# Patient Record
Sex: Female | Born: 1985 | Race: Black or African American | Hispanic: No | Marital: Single | Smoking: Former smoker
Health system: Southern US, Community
[De-identification: ages and names within clinical notes are randomized; demographics above are authoritative.]

## PROBLEM LIST (undated history)

## (undated) DIAGNOSIS — J45909 Unspecified asthma, uncomplicated: Secondary | ICD-10-CM

## (undated) DIAGNOSIS — G43909 Migraine, unspecified, not intractable, without status migrainosus: Secondary | ICD-10-CM

## (undated) DIAGNOSIS — G510 Bell's palsy: Secondary | ICD-10-CM

## (undated) DIAGNOSIS — I10 Essential (primary) hypertension: Secondary | ICD-10-CM

## (undated) HISTORY — PX: HERNIA REPAIR: SHX51

## (undated) HISTORY — DX: Migraine, unspecified, not intractable, without status migrainosus: G43.909

## (undated) HISTORY — PX: WISDOM TOOTH EXTRACTION: SHX21

---

## 2016-12-21 ENCOUNTER — Emergency Department (HOSPITAL_BASED_OUTPATIENT_CLINIC_OR_DEPARTMENT_OTHER)
Admission: EM | Admit: 2016-12-21 | Discharge: 2016-12-21 | Disposition: A | Discharge status: Home / Self Care | Payer: Medicaid Other | Attending: Emergency Medicine | Admitting: Emergency Medicine

## 2016-12-21 ENCOUNTER — Encounter (HOSPITAL_BASED_OUTPATIENT_CLINIC_OR_DEPARTMENT_OTHER): Payer: Self-pay | Admitting: Emergency Medicine

## 2016-12-21 ENCOUNTER — Emergency Department (HOSPITAL_BASED_OUTPATIENT_CLINIC_OR_DEPARTMENT_OTHER): Payer: Medicaid Other

## 2016-12-21 DIAGNOSIS — F17210 Nicotine dependence, cigarettes, uncomplicated: Secondary | ICD-10-CM | POA: Diagnosis not present

## 2016-12-21 DIAGNOSIS — G43109 Migraine with aura, not intractable, without status migrainosus: Secondary | ICD-10-CM | POA: Diagnosis not present

## 2016-12-21 DIAGNOSIS — R51 Headache: Secondary | ICD-10-CM | POA: Diagnosis present

## 2016-12-21 DIAGNOSIS — J45909 Unspecified asthma, uncomplicated: Secondary | ICD-10-CM | POA: Diagnosis not present

## 2016-12-21 HISTORY — DX: Unspecified asthma, uncomplicated: J45.909

## 2016-12-21 LAB — CBC
HCT: 39.1 % (ref 36.0–46.0)
Hemoglobin: 13.1 g/dL (ref 12.0–15.0)
MCH: 31 pg (ref 26.0–34.0)
MCHC: 33.5 g/dL (ref 30.0–36.0)
MCV: 92.7 fL (ref 78.0–100.0)
PLATELETS: 202 10*3/uL (ref 150–400)
RBC: 4.22 MIL/uL (ref 3.87–5.11)
RDW: 12 % (ref 11.5–15.5)
WBC: 4.2 10*3/uL (ref 4.0–10.5)

## 2016-12-21 LAB — BASIC METABOLIC PANEL
Anion gap: 7 (ref 5–15)
BUN: 9 mg/dL (ref 6–20)
CALCIUM: 9 mg/dL (ref 8.9–10.3)
CHLORIDE: 105 mmol/L (ref 101–111)
CO2: 25 mmol/L (ref 22–32)
CREATININE: 0.8 mg/dL (ref 0.44–1.00)
GFR calc non Af Amer: >60 mL/min (ref >60)
Glucose, Bld: 84 mg/dL (ref 65–99)
Potassium: 3.7 mmol/L (ref 3.5–5.1)
SODIUM: 137 mmol/L (ref 135–145)

## 2016-12-21 LAB — TROPONIN I

## 2016-12-21 MED ORDER — DIPHENHYDRAMINE HCL 50 MG/ML IJ SOLN
25.0000 mg | Freq: Once | INTRAMUSCULAR | Status: AC
Start: 2016-12-21 — End: 2016-12-21
  Administered 2016-12-21: 25 mg via INTRAVENOUS
  Filled 2016-12-21: qty 1

## 2016-12-21 MED ORDER — METOCLOPRAMIDE HCL 5 MG/ML IJ SOLN
10.0000 mg | Freq: Once | INTRAMUSCULAR | Status: AC
  Administered 2016-12-21: 10 mg via INTRAVENOUS
  Filled 2016-12-21: qty 2

## 2016-12-21 MED ORDER — KETOROLAC TROMETHAMINE 30 MG/ML IJ SOLN
30.0000 mg | Freq: Once | INTRAMUSCULAR | Status: AC
  Administered 2016-12-21: 30 mg via INTRAVENOUS
  Filled 2016-12-21: qty 1

## 2016-12-21 NOTE — ED Provider Notes (Signed)
[facility] Provider Note   CSN: [id number] Arrival date & time: 12/21/16  1245     History   Chief Complaint Chief Complaint  Patient presents with  . Headache    HPI Deanna Owens is a 31 y.o. female.  The history is provided by the patient.  Headache   This is a new problem. The current episode started 12 to 24 hours ago. The problem occurs constantly. The problem has been gradually worsening. The headache is associated with nothing. The pain is located in the right unilateral region. The quality of the pain is described as throbbing. The pain is moderate. The pain does not radiate. Pertinent negatives include no fever, no chest pressure, no shortness of breath, no nausea and no vomiting. Associated symptoms comments: Chest pain earlier that is resolved, was sharp and aching across the sternum. Treatments tried: advil PM last night. The treatment provided no relief.    Past Medical History:  Diagnosis Date  . Asthma     There are no active problems to display for this patient.   Past Surgical History:  Procedure Laterality Date  . HERNIA REPAIR      OB History    No data available       Home Medications    Prior to Admission medications   Not on File    Family History No family history on file.  Social History Social History  Substance Use Topics  . Smoking status: Current Every Day Smoker    Packs/day: 0.50    Types: Cigarettes  . Smokeless tobacco: Never Used  . Alcohol use Yes     Allergies   Tramadol   Review of Systems Review of Systems  Constitutional: Negative for fever.  Respiratory: Negative for shortness of breath.   Gastrointestinal: Negative for nausea and vomiting.  Neurological: Positive for headaches.  All other systems reviewed and are negative.    Physical Exam Updated Vital Signs BP 145/97 (BP Location: Right Arm)   Pulse 80   Temp 98.5 F (36.9 C) (Oral)   Resp 18   Ht 5\' 4"  (1.626 m)   Wt 140 lb  (63.5 kg)   LMP 12/04/2016 (Exact Date)   SpO2 100%   BMI 24.03 kg/m   Physical Exam  Constitutional: She is oriented to person, place, and time. She appears well-developed and well-nourished. No distress.  HENT:  Head: Normocephalic and atraumatic.  Nose: Nose normal.  Eyes: Conjunctivae are normal.  Neck: Neck supple. No tracheal deviation present.  Cardiovascular: Normal rate, regular rhythm and normal heart sounds.   Pulmonary/Chest: Effort normal and breath sounds normal. No respiratory distress.  Abdominal: Soft. She exhibits no distension.  Neurological: She is alert and oriented to person, place, and time. No cranial nerve deficit. Coordination normal.  Skin: Skin is warm and dry.  Psychiatric: She has a normal mood and affect.     ED Treatments / Results  Labs (all labs ordered are listed, but only abnormal results are displayed) Labs Reviewed  CBC  BASIC METABOLIC PANEL  TROPONIN I    EKG  EKG Interpretation  Date/Time:  Sunday December 21 2016 12:58:22 EST Ventricular Rate:  82 PR Interval:  126 QRS Duration: 86 QT Interval:  410 QTC Calculation: 479 R Axis:   62 Text Interpretation:  Normal sinus rhythm Minimal voltage criteria for LVH, may be normal variant Nonspecific T wave abnormality Inferolateral leads Abnormal ECG Confirmed by Wendelin Reader MD, Lyberti Thrush ([id number]) on 12/21/2016 1:26:13 PM  Radiology No results found.  Procedures Procedures (including critical care time)  Medications Ordered in ED Medications - No data to display   Initial Impression / Assessment and Plan / ED Course  I have reviewed the triage vital signs and the nursing notes.  Pertinent labs & imaging results that were available during my care of the patient were reviewed by me and considered in my medical decision making (see chart for details).  Clinical Course     31 y.o. female presents with right sided headache last night. Had brief sharp chest pain earlier and tingling  of left arm with no neuro deficits. EKG interpreted by me without ST or T wave changes concerning for myocardial ischemia. No delta wave, no prolonged QTc, no brugada to suggest arrhythmogenicity. Troponin negative, low risk for ACS and doubt clinically. Suspect migraine variant. Provided partial migraine cocktail with good relief of symptoms. Plan to follow up with PCP as needed and return precautions discussed for worsening or new concerning symptoms.   Final Clinical Impressions(s) / ED Diagnoses   Final diagnoses:  Migraine with aura and without status migrainosus, not intractable    New Prescriptions New Prescriptions   No medications on file     Lyndal Pulleyaniel Lyall Faciane, MD 12/21/16 1733

## 2016-12-21 NOTE — ED Notes (Signed)
Pt given d/c instructions as per chart. Verbalizes understanding. No questions. 

## 2016-12-21 NOTE — ED Triage Notes (Signed)
States has had pain to right side of head since last night and chest pain for 30 minutes and numbness to left arm driving to [place]. Yesterday had cramping to left hand for 15 min.

## 2017-04-24 ENCOUNTER — Inpatient Hospital Stay (HOSPITAL_BASED_OUTPATIENT_CLINIC_OR_DEPARTMENT_OTHER)
Admission: EM | Admit: 2017-04-24 | Discharge: 2017-04-25 | DRG: 103 | Disposition: A | Discharge status: Home / Self Care | Payer: Medicaid Other | Attending: Family Medicine | Admitting: Family Medicine

## 2017-04-24 ENCOUNTER — Inpatient Hospital Stay (HOSPITAL_COMMUNITY): Payer: Medicaid Other

## 2017-04-24 ENCOUNTER — Emergency Department (HOSPITAL_BASED_OUTPATIENT_CLINIC_OR_DEPARTMENT_OTHER): Payer: Medicaid Other

## 2017-04-24 ENCOUNTER — Encounter (HOSPITAL_BASED_OUTPATIENT_CLINIC_OR_DEPARTMENT_OTHER): Payer: Self-pay

## 2017-04-24 DIAGNOSIS — I160 Hypertensive urgency: Secondary | ICD-10-CM | POA: Diagnosis present

## 2017-04-24 DIAGNOSIS — I10 Essential (primary) hypertension: Secondary | ICD-10-CM | POA: Diagnosis present

## 2017-04-24 DIAGNOSIS — F1410 Cocaine abuse, uncomplicated: Secondary | ICD-10-CM | POA: Diagnosis present

## 2017-04-24 DIAGNOSIS — J45909 Unspecified asthma, uncomplicated: Secondary | ICD-10-CM | POA: Diagnosis present

## 2017-04-24 DIAGNOSIS — F1910 Other psychoactive substance abuse, uncomplicated: Secondary | ICD-10-CM | POA: Diagnosis present

## 2017-04-24 DIAGNOSIS — R531 Weakness: Secondary | ICD-10-CM | POA: Diagnosis present

## 2017-04-24 DIAGNOSIS — F17210 Nicotine dependence, cigarettes, uncomplicated: Secondary | ICD-10-CM | POA: Diagnosis present

## 2017-04-24 DIAGNOSIS — Y900 Blood alcohol level of less than 20 mg/100 ml: Secondary | ICD-10-CM | POA: Diagnosis present

## 2017-04-24 DIAGNOSIS — E876 Hypokalemia: Secondary | ICD-10-CM | POA: Diagnosis present

## 2017-04-24 DIAGNOSIS — R200 Anesthesia of skin: Secondary | ICD-10-CM | POA: Diagnosis not present

## 2017-04-24 DIAGNOSIS — F1010 Alcohol abuse, uncomplicated: Secondary | ICD-10-CM | POA: Diagnosis present

## 2017-04-24 DIAGNOSIS — Z7951 Long term (current) use of inhaled steroids: Secondary | ICD-10-CM

## 2017-04-24 DIAGNOSIS — G43909 Migraine, unspecified, not intractable, without status migrainosus: Secondary | ICD-10-CM | POA: Diagnosis present

## 2017-04-24 DIAGNOSIS — Z885 Allergy status to narcotic agent status: Secondary | ICD-10-CM | POA: Diagnosis not present

## 2017-04-24 DIAGNOSIS — Z79899 Other long term (current) drug therapy: Secondary | ICD-10-CM

## 2017-04-24 HISTORY — DX: Essential (primary) hypertension: I10

## 2017-04-24 LAB — COMPREHENSIVE METABOLIC PANEL
ALBUMIN: 4.3 g/dL (ref 3.5–5.0)
ALK PHOS: 57 U/L (ref 38–126)
ALT: 21 U/L (ref 14–54)
ANION GAP: 10 (ref 5–15)
AST: 24 U/L (ref 15–41)
BUN: 13 mg/dL (ref 6–20)
CALCIUM: 9 mg/dL (ref 8.9–10.3)
CHLORIDE: 107 mmol/L (ref 101–111)
CO2: 23 mmol/L (ref 22–32)
CREATININE: 0.72 mg/dL (ref 0.44–1.00)
GFR calc non Af Amer: >60 mL/min (ref >60)
GLUCOSE: 93 mg/dL (ref 65–99)
Potassium: 3.4 mmol/L — ABNORMAL LOW (ref 3.5–5.1)
SODIUM: 140 mmol/L (ref 135–145)
Total Bilirubin: 0.8 mg/dL (ref 0.3–1.2)
Total Protein: 7.4 g/dL (ref 6.5–8.1)

## 2017-04-24 LAB — BASIC METABOLIC PANEL
Anion gap: 10 (ref 5–15)
BUN: 7 mg/dL (ref 6–20)
CO2: 20 mmol/L — ABNORMAL LOW (ref 22–32)
Calcium: 9 mg/dL (ref 8.9–10.3)
Chloride: 108 mmol/L (ref 101–111)
Creatinine, Ser: 0.57 mg/dL (ref 0.44–1.00)
GFR calc Af Amer: >60 mL/min (ref >60)
GLUCOSE: 90 mg/dL (ref 65–99)
Potassium: 3.1 mmol/L — ABNORMAL LOW (ref 3.5–5.1)
Sodium: 138 mmol/L (ref 135–145)

## 2017-04-24 LAB — TROPONIN I: Troponin I: <0.03 ng/mL (ref <0.03)

## 2017-04-24 LAB — CBC
HCT: 41.1 % (ref 36.0–46.0)
Hemoglobin: 14.1 g/dL (ref 12.0–15.0)
MCH: 31.8 pg (ref 26.0–34.0)
MCHC: 34.3 g/dL (ref 30.0–36.0)
MCV: 92.8 fL (ref 78.0–100.0)
PLATELETS: 224 10*3/uL (ref 150–400)
RBC: 4.43 MIL/uL (ref 3.87–5.11)
RDW: 12.5 % (ref 11.5–15.5)
WBC: 4.7 10*3/uL (ref 4.0–10.5)

## 2017-04-24 LAB — CBC WITH DIFFERENTIAL/PLATELET
Basophils Absolute: 0 10*3/uL (ref 0.0–0.1)
Basophils Relative: 0 %
EOS PCT: 5 %
Eosinophils Absolute: 0.3 10*3/uL (ref 0.0–0.7)
HCT: 43.7 % (ref 36.0–46.0)
Hemoglobin: 14.9 g/dL (ref 12.0–15.0)
Lymphocytes Relative: 47 %
Lymphs Abs: 2.4 10*3/uL (ref 0.7–4.0)
MCH: 31.8 pg (ref 26.0–34.0)
MCHC: 34.1 g/dL (ref 30.0–36.0)
MCV: 93.4 fL (ref 78.0–100.0)
MONO ABS: 0.2 10*3/uL (ref 0.1–1.0)
MONOS PCT: 4 %
NEUTROS ABS: 2.3 10*3/uL (ref 1.7–7.7)
NEUTROS PCT: 44 %
PLATELETS: 206 10*3/uL (ref 150–400)
RBC: 4.68 MIL/uL (ref 3.87–5.11)
RDW: 13 % (ref 11.5–15.5)
WBC: 5.2 10*3/uL (ref 4.0–10.5)

## 2017-04-24 LAB — ETHANOL

## 2017-04-24 LAB — DIFFERENTIAL
BASOS PCT: 0 %
Basophils Absolute: 0 10*3/uL (ref 0.0–0.1)
Eosinophils Absolute: 0.2 10*3/uL (ref 0.0–0.7)
Eosinophils Relative: 5 %
LYMPHS PCT: 40 %
Lymphs Abs: 1.9 10*3/uL (ref 0.7–4.0)
Monocytes Absolute: 0.3 10*3/uL (ref 0.1–1.0)
Monocytes Relative: 7 %
NEUTROS ABS: 2.2 10*3/uL (ref 1.7–7.7)
NEUTROS PCT: 48 %

## 2017-04-24 LAB — RAPID URINE DRUG SCREEN, HOSP PERFORMED
Amphetamines: NOT DETECTED
BARBITURATES: NOT DETECTED
BENZODIAZEPINES: NOT DETECTED
COCAINE: POSITIVE — AB
Opiates: NOT DETECTED
TETRAHYDROCANNABINOL: NOT DETECTED

## 2017-04-24 LAB — URINALYSIS, MICROSCOPIC (REFLEX): RBC / HPF: NONE SEEN RBC/hpf (ref 0–5)

## 2017-04-24 LAB — CBG MONITORING, ED
Comment 1: 30716051
Glucose-Capillary: 96 mg/dL (ref 65–99)

## 2017-04-24 LAB — URINALYSIS, ROUTINE W REFLEX MICROSCOPIC
Bilirubin Urine: NEGATIVE
GLUCOSE, UA: NEGATIVE mg/dL
HGB URINE DIPSTICK: NEGATIVE
KETONES UR: NEGATIVE mg/dL
Nitrite: NEGATIVE
PROTEIN: NEGATIVE mg/dL
Specific Gravity, Urine: 1.014 (ref 1.005–1.030)
pH: 7 (ref 5.0–8.0)

## 2017-04-24 LAB — PROTIME-INR
INR: 1.01
Prothrombin Time: 13.3 seconds (ref 11.4–15.2)

## 2017-04-24 LAB — APTT: APTT: 30 s (ref 24–36)

## 2017-04-24 MED ORDER — PROCHLORPERAZINE EDISYLATE 5 MG/ML IJ SOLN
10.0000 mg | Freq: Once | INTRAMUSCULAR | Status: AC
  Administered 2017-04-25: 10 mg via INTRAVENOUS
  Filled 2017-04-24: qty 2

## 2017-04-24 MED ORDER — ASPIRIN 325 MG PO TABS
325.0000 mg | ORAL_TABLET | Freq: Every day | ORAL | Status: DC
Start: 2017-04-24 — End: 2017-04-25
  Administered 2017-04-25: 325 mg via ORAL
  Filled 2017-04-24: qty 1

## 2017-04-24 MED ORDER — SENNOSIDES-DOCUSATE SODIUM 8.6-50 MG PO TABS: 1.0000 | ORAL_TABLET | Freq: Every evening | ORAL | Status: DC | PRN

## 2017-04-24 MED ORDER — ADULT MULTIVITAMIN W/MINERALS CH
1.0000 | ORAL_TABLET | Freq: Every day | ORAL | Status: DC
  Administered 2017-04-25: 1 via ORAL
  Filled 2017-04-24: qty 1

## 2017-04-24 MED ORDER — KETOROLAC TROMETHAMINE 30 MG/ML IJ SOLN
30.0000 mg | Freq: Once | INTRAMUSCULAR | Status: AC
  Administered 2017-04-25: 30 mg via INTRAVENOUS
  Filled 2017-04-24: qty 1

## 2017-04-24 MED ORDER — ASPIRIN 81 MG PO CHEW
324.0000 mg | CHEWABLE_TABLET | Freq: Once | ORAL | Status: AC
  Administered 2017-04-24: 324 mg via ORAL

## 2017-04-24 MED ORDER — CYPROHEPTADINE HCL 4 MG PO TABS
8.0000 mg | ORAL_TABLET | Freq: Three times a day (TID) | ORAL | Status: DC | PRN
  Filled 2017-04-24: qty 2

## 2017-04-24 MED ORDER — HYDRALAZINE HCL 20 MG/ML IJ SOLN: 10.0000 mg | INTRAMUSCULAR | Status: DC | PRN

## 2017-04-24 MED ORDER — SODIUM CHLORIDE 0.9 % IV SOLN
INTRAVENOUS | Status: DC
  Administered 2017-04-24: via INTRAVENOUS

## 2017-04-24 MED ORDER — POTASSIUM CHLORIDE CRYS ER 20 MEQ PO TBCR
20.0000 meq | EXTENDED_RELEASE_TABLET | ORAL | Status: AC
  Administered 2017-04-25: 20 meq via ORAL
  Filled 2017-04-24: qty 1

## 2017-04-24 MED ORDER — NICOTINE 21 MG/24HR TD PT24
21.0000 mg | MEDICATED_PATCH | Freq: Every day | TRANSDERMAL | Status: DC
  Administered 2017-04-25: 21 mg via TRANSDERMAL
  Filled 2017-04-24: qty 1

## 2017-04-24 MED ORDER — FOLIC ACID 1 MG PO TABS
1.0000 mg | ORAL_TABLET | Freq: Every day | ORAL | Status: DC
  Administered 2017-04-25: 1 mg via ORAL
  Filled 2017-04-24: qty 1

## 2017-04-24 MED ORDER — DIPHENHYDRAMINE HCL 50 MG/ML IJ SOLN
25.0000 mg | Freq: Once | INTRAMUSCULAR | Status: AC
  Administered 2017-04-25: 25 mg via INTRAVENOUS
  Filled 2017-04-24: qty 1

## 2017-04-24 MED ORDER — LORAZEPAM 1 MG PO TABS: 1.0000 mg | ORAL_TABLET | Freq: Four times a day (QID) | ORAL | Status: DC | PRN

## 2017-04-24 MED ORDER — LORAZEPAM 2 MG/ML IJ SOLN
1.0000 mg | Freq: Once | INTRAMUSCULAR | Status: AC | PRN
  Administered 2017-04-24: 1 mg via INTRAVENOUS
  Filled 2017-04-24: qty 1

## 2017-04-24 MED ORDER — STROKE: EARLY STAGES OF RECOVERY BOOK
Freq: Once | Status: AC
  Administered 2017-04-25
  Filled 2017-04-24: qty 1

## 2017-04-24 MED ORDER — ACETAMINOPHEN 160 MG/5ML PO SOLN: 650.0000 mg | ORAL | Status: DC | PRN

## 2017-04-24 MED ORDER — GADOBENATE DIMEGLUMINE 529 MG/ML IV SOLN
13.0000 mL | Freq: Once | INTRAVENOUS | Status: AC | PRN
  Administered 2017-04-24: 13 mL via INTRAVENOUS

## 2017-04-24 MED ORDER — ASPIRIN 300 MG RE SUPP: 300.0000 mg | Freq: Every day | RECTAL | Status: DC

## 2017-04-24 MED ORDER — ACETAMINOPHEN 325 MG PO TABS
650.0000 mg | ORAL_TABLET | ORAL | Status: DC | PRN
  Administered 2017-04-25: 650 mg via ORAL
  Filled 2017-04-24: qty 2

## 2017-04-24 MED ORDER — ASPIRIN 81 MG PO CHEW
CHEWABLE_TABLET | ORAL | Status: AC
  Filled 2017-04-24: qty 4

## 2017-04-24 MED ORDER — MELATONIN 3 MG PO TABS
3.0000 mg | ORAL_TABLET | Freq: Every evening | ORAL | Status: DC | PRN
  Filled 2017-04-24: qty 1

## 2017-04-24 MED ORDER — ACETAMINOPHEN 650 MG RE SUPP: 650.0000 mg | RECTAL | Status: DC | PRN

## 2017-04-24 MED ORDER — ENOXAPARIN SODIUM 40 MG/0.4ML ~~LOC~~ SOLN
40.0000 mg | SUBCUTANEOUS | Status: DC
  Administered 2017-04-25: 40 mg via SUBCUTANEOUS
  Filled 2017-04-24: qty 0.4

## 2017-04-24 MED ORDER — VITAMIN B-1 100 MG PO TABS
100.0000 mg | ORAL_TABLET | Freq: Every day | ORAL | Status: DC
  Administered 2017-04-25: 100 mg via ORAL
  Filled 2017-04-24: qty 1

## 2017-04-24 MED ORDER — THIAMINE HCL 100 MG/ML IJ SOLN: 100.0000 mg | Freq: Every day | INTRAMUSCULAR | Status: DC

## 2017-04-24 MED ORDER — LORAZEPAM 2 MG/ML IJ SOLN: 1.0000 mg | Freq: Four times a day (QID) | INTRAMUSCULAR | Status: DC | PRN

## 2017-04-24 NOTE — ED Triage Notes (Addendum)
Pt sent from PCP for HTN-pt c/o numbness to tongue, feeling like face drawing, eye twitching-sx started 11a-reports recent HTN dx -NAD-steady gait

## 2017-04-24 NOTE — Consult Note (Signed)
Neurology Consultation Reason for Consult: Left-sided weakness Referring Physician: Katrinka Blazing, R  CC: Left sided weakness  History is obtained from: Patient  HPI: Deanna Owens is a 31 y.o. female with a history of migraines who presents with left-sided weakness that started this morning. She was brushing her teeth and noticed that the toothpaste dripped out of her mouth. She describes that she felt like she had some numbness of the left side of her face and then this gradually got worse. She then noticed that she had some weakness of her left arm and leg. She then over the course of the day noticed progressive worsening of her left-sided weakness. She also noticed a severe photophobic headache as well as some spots in her vision.  She had an MRI here which does not reveal any acute infarct, though it does show some fibromuscular dysplasia.   LKW: 5/10 prior to bed tpa given?: no, out of window   ROS: A 14 point ROS was performed and is negative except as noted in the HPI.   Past Medical History:  Diagnosis Date  . Asthma   . Hypertension      Family history: Mother with migraines, she suspects that she gets some weakness with them.   Social History:  reports that she has been smoking Cigarettes.  She has been smoking about 0.50 packs per day. She has never used smokeless tobacco. She reports that she drinks alcohol. She reports that she uses drugs, including Marijuana.   Exam: Current vital signs: BP (!) 187/104 (BP Location: Right Arm)   Pulse 78   Temp 98.3 F (36.8 C)   Resp 17   Ht 5\' 4"  (1.626 m)   Wt 66.2 kg (146 lb)   LMP  (LMP Unknown)   SpO2 100%   BMI 25.06 kg/m  Vital signs in last 24 hours: Temp:  [98 F (36.7 C)-99.2 F (37.3 C)] 98.3 F (36.8 C) (05/11 2218) Pulse Rate:  [74-85] 78 (05/11 2218) Resp:  [14-20] 17 (05/11 2218) BP: (153-187)/(104-129) 187/104 (05/11 2218) SpO2:  [98 %-100 %] 100 % (05/11 2218) Weight:  [66.2 kg (146 lb)] 66.2 kg (146 lb)  (05/11 1444)   Physical Exam  Constitutional: Appears well-developed and well-nourished.  Psych: Affect appropriate to situation Eyes: No scleral injection HENT: No OP obstrucion Head: Normocephalic.  Cardiovascular: Normal rate and regular rhythm.  Respiratory: Effort normal and breath sounds normal to anterior ascultation GI: Soft.  No distension. There is no tenderness.  Skin: WDI  Neuro: Mental Status: Patient is awake, alert, oriented to person, place, month, year, and situation. Patient is able to give a clear and coherent history. No signs of aphasia or neglect Cranial Nerves: II: Visual Fields are full. Pupils are equal, round, and reactive to light.   III,IV, VI: EOMI without ptosis or diploplia.  V: Facial sensation is decreased in the left side of the lower half of the face, not on the for head VII: Facial movement with decreased movement on the left side, which she reports that her cheeks do puff out equally without air leak. VIII: hearing is intact to voice X: Uvula elevates symmetrically XI: Shoulder shrug is symmetric. XII: tongue is midline without atrophy or fasciculations.  Motor: Tone is normal. Bulk is normal. She has 4 minus/5 strength of the left arm and leg. She does have some pronator drift on the left, but no orbital sign. Sensory: Sensation is symmetric to light touch in the arms and legs. Cerebellar: FNF intact  bilaterally   I have reviewed labs in epic and the results pertinent to this consultation are: CMP-unremarkable  I have reviewed the images obtained: MRI brain-no acute infarct increased white matter changes could be result of her known hypertension.  Impression: 31 year old female with left-sided weakness in the setting of recent cocaine use. I do wonder about, complicated migraine given her photophobic headache. Would favor treating as such at this time, and further evaluation pending response to treatment.  With a stroke on MRI and  persistent symptoms, I will cancel the stroke workup including echo, carotid, therapy evaluations. If she remains symptomatic than therapy evaluations will need to be reordered.  Recommendations: 1) Compazine, Benadryl, Toradol 2) reevaluate following treatment.   Ritta SlotMcNeill Jonnelle Lawniczak, MD [facility] [phone]  If 7pm- 7am, please page neurology on call as listed in AMION.

## 2017-04-24 NOTE — Progress Notes (Signed)
Attempted report x1. 

## 2017-04-24 NOTE — Progress Notes (Signed)
Attempted report x 2.  Nurse not ready

## 2017-04-24 NOTE — ED Notes (Signed)
ED Provider at bedside. 

## 2017-04-24 NOTE — ED Notes (Signed)
Pt on cardiac monitor and automatic VS q15

## 2017-04-24 NOTE — ED Notes (Signed)
Report given to Lauren RN [address]

## 2017-04-24 NOTE — ED Notes (Signed)
[facility] here for transport.  

## 2017-04-24 NOTE — Progress Notes (Signed)
Deanna Owens is a 31 y.o. female patient admitted from ED awake, alert - oriented  X 4 - no acute distress noted.  VSS - Blood pressure (!) 170/117, pulse 77, temperature 98 F (36.7 C), temperature source Oral, resp. rate 20, height 5\' 4"  (1.626 m), weight 66.2 kg (146 lb), SpO2 100 %.    IV in place, occlusive dsg intact without redness.  Orientation to room, and floor completed with information packet given to patient/family.  Patient declined safety video at this time.  Admission INP armband ID verified with patient/family, and in place.   SR up x 2, fall assessment complete, with patient and family able to verbalize understanding of risk associated with falls, and verbalized understanding to call nsg before up out of bed.  Call light within reach, patient able to voice, and demonstrate understanding.  Skin, clean-dry- intact without evidence of bruising, or skin tears.  Paged MD Hanley BenAlekh regarding high B/P and facial droop and left sided weakness and Severe Left ear pain. No evidence of skin break down noted on exam.     Will cont to eval and treat per MD orders.  Deanna BioLoren D Alexzandria Massman, RN 04/24/2017 6:56 PM

## 2017-04-24 NOTE — ED Notes (Signed)
amb to bR w/o difficulty

## 2017-04-24 NOTE — H&P (Signed)
History and Physical    Deanna Owens WUJ:[id number] DOB: 02/14/1986 DOA: 04/24/2017  Referring MD/NP/PA:  Glade Lloyd, MD PCP: [facility]  Patient coming from:PCP office to [facility]   Chief Complaint: Left-sided numbness and weakness  HPI: Deanna Owens is a 31 y.o. left-hand dominant female with medical history significant of HTN, asthma, allergies, and tobacco/alcohol use; who presents with complaints of left-sided numbness and weakness.  Patient actually noted having a abnormal sensation on the left side of her tongue starting yesterday. Prior to onset of symptoms patient had been in a normal state of health. Reports "tongue felt oily", but initially just dismissed symptoms. However, this morning while brushing her teeth she noted that all trying to gargle and spit liquid running out of the left side of her mouth and that was unusual for her. Associated symptoms included feelings of numbness and weakness of the upper/ lower left side, mild balance disturbance, and intermittent hot flashes(new in last 3-4 weeks). Denies any recent illness, palpitations, chest pain, headache, change in vision, shortness of breath, falls, nausea, vomiting, traumas, neck pain, new medications, or history of MS/brain tumor/clotting disorder. She previously reported loss of feeling of lower body following epidural during childbirth requiring rehabilitation to walk. Patient admits to not taking blood pressure medications of lisinopril as She was seen by her PCP who noted significantly elevated blood pressure and due to symptoms recommended evaluation for which the patient was seen at [facility]. Admits to drinking anywhere from 3-4 glasses of wine and smoking half pack cigarettes on a daily  basis.  ED Course: Upon admission into the emergency department patient was seen to be afebrile, with blood pressures elevated to 186/129, and all other vital signs within normal limits. Labs revealed mildly low  potassium at 3.4. CT scan of the brain showed no acute signs of a stroke. UDS came back positive for cocaine. Due to the persistent symptoms patient was admitted for further workup. She was not a TPA candidate and was accepted for transfer to inpatient telemetry bed by  Glade Lloyd, MD.  Review of Systems: As per HPI otherwise 10 point review of systems negative.   Past Medical History:  Diagnosis Date  . Asthma   . Hypertension     Past Surgical History:  Procedure Laterality Date  . HERNIA REPAIR       reports that she has been smoking Cigarettes.  She has been smoking about 0.50 packs per day. She has never used smokeless tobacco. She reports that she drinks alcohol. She reports that she uses drugs, including Marijuana.  Allergies  Allergen Reactions  . Tramadol Anaphylaxis    No family history on file.  Prior to Admission medications   Medication Sig Start Date End Date Taking? Authorizing Provider  cyproheptadine (PERIACTIN) 4 MG tablet Take 8 mg by mouth 3 (three) times daily as needed for allergies.   Yes [provider]  Fluticasone Propionate, Inhal, (FLOVENT IN) Inhale into the lungs.   Yes [provider]  ibuprofen (ADVIL,MOTRIN) 400 MG tablet Take 400 mg by mouth every 6 (six) hours as needed.   Yes [provider]  Melatonin 3 MG TABS Take by mouth.   Yes [provider]    Physical Exam: Constitutional: Young female who appears to be in mild distress able to follow commands. Vitals:   04/24/17 1615 04/24/17 1630 04/24/17 1700 04/24/17 1852  BP:  (!) 153/105 (!) 170/118 (!) 170/117  Pulse: 85 77 74 77  Resp: 19 18 17 20   Temp:    98 F (36.7 C)  TempSrc:    Oral  SpO2: 98% 99% 100%   Weight:      Height:       Eyes: PERRL, lids and conjunctivae normal ENMT: Mucous membranes are moist. Posterior pharynx clear of any exudate or lesions. Normal dentition.  Neck: normal, supple, no masses, no thyromegaly Respiratory:  clear to auscultation bilaterally, no wheezing, no crackles. Normal respiratory effort. No accessory muscle use.  Cardiovascular: Regular rate and rhythm, no murmurs / rubs / gallops. No extremity edema. 2+ pedal pulses. No carotid bruits.  Abdomen: no tenderness, no masses palpated. No hepatosplenomegaly. Bowel sounds positive.  Musculoskeletal: no clubbing / cyanosis. No joint deformity upper and lower extremities. Good ROM, no contractures. Normal muscle tone.  Skin: no rashes, lesions, ulcers. No induration Neurologic: CN 2-12 grossly intact. Sensation intact, DTR normal. Strength 5/5 on right upper/ lower extremity and 4/5 on left upper/lower extremities. Facial droop noted on left side with mild dysarthria. Abnormal sensation also noted on left side. Psychiatric: Poor judgment and insight. Alert and oriented x 3. Anxious mood.     Labs on Admission: I have personally reviewed following labs and imaging studies  CBC:  Recent Labs Lab 04/24/17 1527  WBC 4.7  NEUTROABS 2.2  HGB 14.1  HCT 41.1  MCV 92.8  PLT 224   Basic Metabolic Panel:  Recent Labs Lab 04/24/17 1527  NA 140  K 3.4*  CL 107  CO2 23  GLUCOSE 93  BUN 13  CREATININE 0.72  CALCIUM 9.0   GFR: Estimated Creatinine Clearance: 94.5 mL/min (by C-G formula based on SCr of 0.72 mg/dL). Liver Function Tests:  Recent Labs Lab 04/24/17 1527  AST 24  ALT 21  ALKPHOS 57  BILITOT 0.8  PROT 7.4  ALBUMIN 4.3   No results for input(s): LIPASE, AMYLASE in the last 168 hours. No results for input(s): AMMONIA in the last 168 hours. Coagulation Profile:  Recent Labs Lab 04/24/17 1527  INR 1.01   Cardiac Enzymes:  Recent Labs Lab 04/24/17 1536  TROPONINI <0.03   BNP (last 3 results) No results for input(s): PROBNP in the last 8760 hours. HbA1C: No results for input(s): HGBA1C in the last 72 hours. CBG:  Recent Labs Lab 04/24/17 1451  GLUCAP 96   Lipid Profile: No results for input(s): CHOL,  HDL, LDLCALC, TRIG, CHOLHDL, LDLDIRECT in the last 72 hours. Thyroid Function Tests: No results for input(s): TSH, T4TOTAL, FREET4, T3FREE, THYROIDAB in the last 72 hours. Anemia Panel: No results for input(s): VITAMINB12, FOLATE, FERRITIN, TIBC, IRON, RETICCTPCT in the last 72 hours. Urine analysis:    Component Value Date/Time   COLORURINE YELLOW 04/24/2017 1715   APPEARANCEUR CLEAR 04/24/2017 1715   LABSPEC 1.014 04/24/2017 1715   PHURINE 7.0 04/24/2017 1715   GLUCOSEU NEGATIVE 04/24/2017 1715   HGBUR NEGATIVE 04/24/2017 1715   BILIRUBINUR NEGATIVE 04/24/2017 1715   KETONESUR NEGATIVE 04/24/2017 1715   PROTEINUR NEGATIVE 04/24/2017 1715   NITRITE NEGATIVE 04/24/2017 1715   LEUKOCYTESUR TRACE (A) 04/24/2017 1715   Sepsis Labs: No results found for this or any previous visit (from the past 240 hour(s)).   Radiological Exams on Admission: Ct Head Wo Contrast  Result Date: 04/24/2017 CLINICAL DATA:  Numbness, weakness EXAM: CT HEAD WITHOUT CONTRAST TECHNIQUE: Contiguous axial images were obtained from the base of the skull through the vertex without intravenous contrast. COMPARISON:  None. FINDINGS: Brain: No evidence of acute  infarction, hemorrhage, hydrocephalus, extra-axial collection or mass lesion/mass effect. Vascular: No hyperdense vessel or unexpected calcification. Skull: Normal. Negative for fracture or focal lesion. Sinuses/Orbits: Partial opacification of the left maxillary sinus. The mastoid air cells are unopacified. Other: None. IMPRESSION: Normal head CT. Electronically Signed   By: Charline BillsSriyesh  Krishnan M.D.   On: 04/24/2017 15:23    EKG: Independently reviewed. Sinus rhythm with LAA  Assessment/Plan Left-sided numbness and weakness: Acute. Patient with persistent left-sided numbness, weakness, and facial droop over the last 24 hours. Suspected acute CVA. Risk factors include  HTN, cocaine/tobacco abuse. Versus complex migraine - Admit to telemetry bed - Stroke order set  initiated - Neuro checks - Check  MRI/MRA head w/o contrast, Vas carotid U/S,  and MRA neck w/ w/o contrast - Appreciate neurology consultative services, will follow-up for further recommendations  Essential hypertension, uncontrolled: Acute. Blood pressure is noted to be as high as 186/129 on admission. Patient admits to not taking lisinopril. - Restart home blood pressure medications when medically appropriate  Hypokalemia: Acute. Initial potassium mildly low at 3.4. - Give 20 mEq of potassium chloride 1 - Continue to monitor and replace as needed  Polysubstance abuse (Alcohol/tobacco/cocaine): UDS positive for cocaine. Patient admits alcohol and tobacco use. - CWIA - Counseled on the need of cessation of polysubstance abuse - Check HIV and blood cultures 2 as  question if symptoms could be cardioembolic  DVT prophylaxis: Lovenox   Code Status: Full  Family Communication: No family present at bedside Disposition Plan: TBD Consults called: Neurology  Admission status: Observation  Clydie Braunondell A Keirston Saephanh MD [facility] Pager [phone]  If 7PM-7AM, please contact night-coverage [email or web address] Password [email or web address]  04/24/2017, 7:32 PM

## 2017-04-24 NOTE — ED Notes (Signed)
[facility] just called to give report. RN giving report to RN on [phone]

## 2017-04-24 NOTE — ED Provider Notes (Signed)
[facility] Provider Note   CSN: [id number] Arrival date & time: 04/24/17  1433     History   Chief Complaint Chief Complaint  Patient presents with  . Hypertension  . Weakness    HPI Deanna Owens is a 31 y.o. female.  The history is provided by the patient.  Neurologic Problem  This is a new problem. Episode onset: >9 hrs PTA. The problem occurs constantly. The problem has been gradually worsening. Pertinent negatives include no chest pain, no abdominal pain, no headaches and no shortness of breath. Nothing aggravates the symptoms. Nothing relieves the symptoms. She has tried nothing for the symptoms. The treatment provided no relief.   Patient reported noting left face numbness that began approximately 7:00 this morning. Patient also noted left hand and lower extremity weakness. Both of these symptoms have gradually worsened since onset. Left facial numbness is progressed to left facial weakness.   Past Medical History:  Diagnosis Date  . Asthma   . Hypertension     There are no active problems to display for this patient.   Past Surgical History:  Procedure Laterality Date  . HERNIA REPAIR      OB History    No data available       Home Medications    Prior to Admission medications   Medication Sig Start Date End Date Taking? Authorizing Provider  cyproheptadine (PERIACTIN) 4 MG tablet Take 8 mg by mouth 3 (three) times daily as needed for allergies.   Yes [provider]  Fluticasone Propionate, Inhal, (FLOVENT IN) Inhale into the lungs.   Yes [provider]  ibuprofen (ADVIL,MOTRIN) 400 MG tablet Take 400 mg by mouth every 6 (six) hours as needed.   Yes [provider]  Melatonin 3 MG TABS Take by mouth.   Yes [provider]    Family History No family history on file.  Social History Social History  Substance Use Topics  . Smoking status: Current Every Day Smoker    Packs/day: 0.50    Types:  Cigarettes  . Smokeless tobacco: Never Used  . Alcohol use Yes     Comment: daily     Allergies   Tramadol   Review of Systems Review of Systems  Respiratory: Negative for shortness of breath.   Cardiovascular: Negative for chest pain.  Gastrointestinal: Negative for abdominal pain.  Neurological: Negative for headaches.  All other systems are reviewed and are negative for acute change except as noted in the HPI    Physical Exam Updated Vital Signs BP (!) 159/114   Pulse 78   Temp 99.2 F (37.3 C) (Oral)   Resp 19   Ht 5\' 4"  (1.626 m)   Wt 146 lb (66.2 kg)   LMP  (LMP Unknown)   SpO2 99%   BMI 25.06 kg/m   Physical Exam  Constitutional: She is oriented to person, place, and time. She appears well-developed and well-nourished. No distress.  HENT:  Head: Normocephalic and atraumatic.  Nose: Nose normal.  Eyes: Conjunctivae and EOM are normal. Pupils are equal, round, and reactive to light. Right eye exhibits no discharge. Left eye exhibits no discharge. No scleral icterus.  Neck: Normal range of motion. Neck supple.  Cardiovascular: Normal rate and regular rhythm.  Exam reveals no gallop and no friction rub.   No murmur heard. Pulmonary/Chest: Effort normal and breath sounds normal. No stridor. No respiratory distress. She has no rales.  Abdominal: Soft. She exhibits no distension. There is  no tenderness.  Musculoskeletal: She exhibits no edema or tenderness.  Neurological: She is alert and oriented to person, place, and time.  Mental Status: Alert and oriented to person, place, and time. Attention and concentration normal. Speech clear. Recent memory is intac  Cranial Nerves  II Visual Fields: Intact to confrontation. Visual fields intact. III, IV, VI: Pupils equal and reactive to light and near. Full eye movement without nystagmus  V Facial Sensation: Decreased sensation on the left VII: Left facial droop with weakness of the left forehead. VIII Auditory Acuity:  Grossly normal  IX/X: The uvula is midline; the palate elevates symmetrically  XI: Normal sternocleidomastoid and trapezius strength  XII: The tongue is midline. No atrophy or fasciculations.   Motor System: Muscle Strength: 4 out of 5 strength in the left upper and lower extremities. 5/5 in the right upper and lower extremities. No pronation or drift.  Muscle Tone: Tone and muscle bulk are normal in the upper and lower extremities.   Reflexes: DTRs: 2+ and symmetrical in all four extremities. Plantar responses are flexor bilaterally.  Coordination: Intact finger-to-nose, heel-to-shin, and rapid alternating movements. No tremor.  Sensation: Decreased sensation on the left hemibody Gait: deferred   Skin: Skin is warm and dry. No rash noted. She is not diaphoretic. No erythema.  Psychiatric: She has a normal mood and affect.  Vitals reviewed.    ED Treatments / Results  Labs (all labs ordered are listed, but only abnormal results are displayed) Labs Reviewed  COMPREHENSIVE METABOLIC PANEL - Abnormal; Notable for the following:       Result Value   Potassium 3.4 (*)    All other components within normal limits  ETHANOL  PROTIME-INR  APTT  CBC  DIFFERENTIAL  TROPONIN I  RAPID URINE DRUG SCREEN, HOSP PERFORMED  URINALYSIS, ROUTINE W REFLEX MICROSCOPIC  CBG MONITORING, ED    EKG  EKG Interpretation  Date/Time:  Friday Apr 24 2017 14:51:21 EDT Ventricular Rate:  83 PR Interval:    QRS Duration: 90 QT Interval:  399 QTC Calculation: 469 R Axis:   55 Text Interpretation:  Sinus rhythm Probable left atrial enlargement RSR' in V1 or V2, probably normal variant Borderline T wave abnormalities Otherwise no significant change Confirmed by [facility] MD, Kati Riggenbach ([id number]) on 04/24/2017 3:00:55 PM       Radiology Ct Head Wo Contrast  Result Date: 04/24/2017 CLINICAL DATA:  Numbness, weakness EXAM: CT HEAD WITHOUT CONTRAST TECHNIQUE: Contiguous axial images were obtained from the base  of the skull through the vertex without intravenous contrast. COMPARISON:  None. FINDINGS: Brain: No evidence of acute infarction, hemorrhage, hydrocephalus, extra-axial collection or mass lesion/mass effect. Vascular: No hyperdense vessel or unexpected calcification. Skull: Normal. Negative for fracture or focal lesion. Sinuses/Orbits: Partial opacification of the left maxillary sinus. The mastoid air cells are unopacified. Other: None. IMPRESSION: Normal head CT. Electronically Signed   By: Charline Bills M.D.   On: 04/24/2017 15:23    Procedures Procedures (including critical care time)  Medications Ordered in ED Medications  aspirin chewable tablet 324 mg (not administered)     Initial Impression / Assessment and Plan / ED Course  I have reviewed the triage vital signs and the nursing notes.  Pertinent labs & imaging results that were available during my care of the patient were reviewed by me and considered in my medical decision making (see chart for details).  Clinical Course as of Apr 25 1639  Fri Apr 24, 2017  1515 Left facial  droop with forehead weakness (suspicious more for Bell's Palsy), but patient does have left sided deficits. LKN 0700 this am. Currently out of the Code stroke window. VAN negative for large vessel occlusion. Will however work up for possible stroke.   [PC]  1638 CT head negative. Discussed case with neurology who recommending MRI and carotid Dopplers. Patient will be admitted to hospitalist service for further workup and management.  [PC]    Clinical Course User Index [PC] Chipper Koudelka, Amadeo Garnet, MD      Final Clinical Impressions(s) / ED Diagnoses   Final diagnoses:  Left-sided weakness      Jasani Dolney, Amadeo Garnet, MD 04/24/17 1640

## 2017-04-24 NOTE — ED Notes (Signed)
Patient transported to CT 

## 2017-04-25 ENCOUNTER — Inpatient Hospital Stay (HOSPITAL_COMMUNITY): Payer: Medicaid Other

## 2017-04-25 ENCOUNTER — Encounter (HOSPITAL_COMMUNITY): Payer: Self-pay | Admitting: Family Medicine

## 2017-04-25 DIAGNOSIS — F1910 Other psychoactive substance abuse, uncomplicated: Secondary | ICD-10-CM

## 2017-04-25 DIAGNOSIS — R531 Weakness: Secondary | ICD-10-CM

## 2017-04-25 DIAGNOSIS — R200 Anesthesia of skin: Secondary | ICD-10-CM

## 2017-04-25 DIAGNOSIS — I160 Hypertensive urgency: Secondary | ICD-10-CM

## 2017-04-25 DIAGNOSIS — E876 Hypokalemia: Secondary | ICD-10-CM

## 2017-04-25 LAB — LIPID PANEL
CHOL/HDL RATIO: 4.7 ratio
Cholesterol: 174 mg/dL (ref 0–200)
HDL: 37 mg/dL — ABNORMAL LOW (ref >40)
LDL CALC: 110 mg/dL — AB (ref 0–99)
Triglycerides: 136 mg/dL (ref <150)
VLDL: 27 mg/dL (ref 0–40)

## 2017-04-25 LAB — TSH: TSH: 4.576 u[IU]/mL — AB (ref 0.350–4.500)

## 2017-04-25 LAB — TROPONIN I

## 2017-04-25 MED ORDER — LISINOPRIL 20 MG PO TABS: 20.0000 mg | ORAL_TABLET | Freq: Every day | ORAL | 0 refills | Status: DC

## 2017-04-25 MED ORDER — LISINOPRIL 10 MG PO TABS
10.0000 mg | ORAL_TABLET | Freq: Every day | ORAL | Status: DC
  Administered 2017-04-25: 10 mg via ORAL
  Filled 2017-04-25: qty 1

## 2017-04-25 MED ORDER — THIAMINE HCL 100 MG PO TABS: 100.0000 mg | ORAL_TABLET | Freq: Every day | ORAL | Status: DC

## 2017-04-25 MED ORDER — LISINOPRIL 10 MG PO TABS: 10.0000 mg | ORAL_TABLET | Freq: Every day | ORAL | Status: DC

## 2017-04-25 MED ORDER — AMLODIPINE BESYLATE 5 MG PO TABS: 5.0000 mg | ORAL_TABLET | Freq: Every day | ORAL | Status: DC

## 2017-04-25 MED ORDER — POTASSIUM CHLORIDE CRYS ER 20 MEQ PO TBCR: 60.0000 meq | EXTENDED_RELEASE_TABLET | Freq: Once | ORAL | Status: DC

## 2017-04-25 MED ORDER — LISINOPRIL 20 MG PO TABS: 20.0000 mg | ORAL_TABLET | Freq: Every day | ORAL | Status: DC

## 2017-04-25 MED ORDER — FOLIC ACID 1 MG PO TABS
1.0000 mg | ORAL_TABLET | Freq: Every day | ORAL | 0 refills | Status: AC
Start: 2017-04-26 — End: 2017-05-26

## 2017-04-25 MED ORDER — POTASSIUM CHLORIDE CRYS ER 20 MEQ PO TBCR
40.0000 meq | EXTENDED_RELEASE_TABLET | Freq: Once | ORAL | Status: AC
  Administered 2017-04-25: 40 meq via ORAL
  Filled 2017-04-25: qty 2

## 2017-04-25 MED ORDER — ADULT MULTIVITAMIN W/MINERALS CH
1.0000 | ORAL_TABLET | Freq: Every day | ORAL | Status: DC
Start: 2017-04-26 — End: 2017-06-05

## 2017-04-25 MED ORDER — RIZATRIPTAN BENZOATE 10 MG PO TBDP: 10.0000 mg | ORAL_TABLET | ORAL | 0 refills | Status: DC | PRN

## 2017-04-25 NOTE — Evaluation (Signed)
Physical Therapy Evaluation Patient Details Name: Deanna Owens MRN: [id number] DOB: 13-Dec-1986 Today's Date: 04/25/2017   History of Present Illness  Pt is a 31 y/o female admitted secondary to L sided numbness and weakness. MRI was negative for any acute infarct/hemorrhage. Pt was positive for cocaine. PMH including but not limited to asthma and HTN.  Clinical Impression  Pt presented sitting EOB, awake and willing to participate in therapy session. Prior to admission, pt reported that she was independent with all functional mobility and ADLs. Pt lives with her three sons (ages 5, 5 and 2 years). Her mother lives very close to them and is able to assist when needed. Pt ambulated in hallway with one HHA with modest instability; however, she had no LOB or need for physical assistance. Pt's gait speed was much slower than typical, young, healthy individuals. Pt stated that she normally ambulates cautiously as she occasionally has severe back pain that knocks her to the ground. Pt would continue to benefit from skilled physical therapy services at this time while admitted and after d/c to address the below listed limitations in order to improve overall safety and independence with functional mobility.      Follow Up Recommendations Outpatient PT    Equipment Recommendations  None recommended by PT    Recommendations for Other Services Speech consult;OT consult     Precautions / Restrictions Precautions Precautions: None Restrictions Weight Bearing Restrictions: No      Mobility  Bed Mobility Overal bed mobility: Modified Independent                Transfers Overall transfer level: Needs assistance Equipment used: None Transfers: Sit to/from Stand Sit to Stand: Supervision         General transfer comment: supervision for safety  Ambulation/Gait Ambulation/Gait assistance: Min guard Ambulation Distance (Feet): 100 Feet Assistive device: 1 person hand held  assist Gait Pattern/deviations: Step-through pattern;Decreased step length - right;Decreased step length - left;Decreased stride length;Antalgic Gait velocity: decreased Gait velocity interpretation: Below normal speed for age/gender General Gait Details: modest instability but no LOB, close min guard for safety with one HHA and pt frequently reaching for railing in hallway as well  Stairs            Wheelchair Mobility    Modified Rankin (Stroke Patients Only)       Balance Overall balance assessment: Needs assistance Sitting-balance support: Feet supported;No upper extremity supported Sitting balance-Leahy Scale: Good     Standing balance support: During functional activity;No upper extremity supported Standing balance-Leahy Scale: Fair Standing balance comment: pt able to stand at sink and brush her teeth without UE supports, supervision for safety                             Pertinent Vitals/Pain Pain Assessment: No/denies pain    Home Living Family/patient expects to be discharged to:: Private residence Living Arrangements: Children Available Help at Discharge: Family;Available 24 hours/day Type of Home: House Home Access: Stairs to enter Entrance Stairs-Rails: Doctor, general practice of Steps: 5 Home Layout: One level Home Equipment: Emergency planning/management officer - 2 wheels      Prior Function Level of Independence: Independent         Comments: in school full-time (online)     Hand Dominance   Dominant Hand: Left    Extremity/Trunk Assessment   Upper Extremity Assessment Upper Extremity Assessment: LUE deficits/detail LUE Deficits / Details: pt with decreased grip  strength in comparison to R side (pt is L hand dominant and would expect L grip to be >R)    Lower Extremity Assessment Lower Extremity Assessment: LLE deficits/detail LLE Deficits / Details: MMT revealed 3+/5 for hip flexion, 4/5 hip abduction, 4/5 hip adduction, 5/5 for  knee extension, 4/5 for knee flexion, 4/5 for ankle DF. Sensation grossly intact.    Cervical / Trunk Assessment Cervical / Trunk Assessment: Normal  Communication   Communication: No difficulties  Cognition Arousal/Alertness: Awake/alert Behavior During Therapy: WFL for tasks assessed/performed Overall Cognitive Status: Within Functional Limits for tasks assessed                                        General Comments      Exercises     Assessment/Plan    PT Assessment Patient needs continued PT services  PT Problem List Decreased strength;Decreased balance;Decreased mobility;Decreased coordination;Decreased knowledge of use of DME;Decreased safety awareness;Impaired sensation       PT Treatment Interventions DME instruction;Gait training;Stair training;Functional mobility training;Therapeutic exercise;Balance training;Therapeutic activities;Neuromuscular re-education;Patient/family education    PT Goals (Current goals can be found in the Care Plan section)  Acute Rehab PT Goals Patient Stated Goal: return home, regain strength on L PT Goal Formulation: With patient Time For Goal Achievement: 05/09/17 Potential to Achieve Goals: Good    Frequency Min 3X/week   Barriers to discharge        Co-evaluation               AM-PAC PT "6 Clicks" Daily Activity  Outcome Measure Difficulty turning over in bed (including adjusting bedclothes, sheets and blankets)?: None Difficulty moving from lying on back to sitting on the side of the bed? : None Difficulty sitting down on and standing up from a chair with arms (e.g., wheelchair, bedside commode, etc,.)?: A Little Help needed moving to and from a bed to chair (including a wheelchair)?: A Little Help needed walking in hospital room?: A Little Help needed climbing 3-5 steps with a railing? : A Little 6 Click Score: 20    End of Session Equipment Utilized During Treatment: Gait belt Activity Tolerance:  Patient tolerated treatment well Patient left: in bed;with call bell/phone within reach;Other (comment) (nurse tech in room to assess vitals) Nurse Communication: Mobility status PT Visit Diagnosis: Unsteadiness on feet (R26.81);Other abnormalities of gait and mobility (R26.89)    Time: [id number] PT Time Calculation (min) (ACUTE ONLY): 24 min   Charges:   PT Evaluation $PT Eval Moderate Complexity: 1 Procedure PT Treatments $Gait Training: 8-22 mins   PT G Codes:        ClovisJennifer Kesleigh Morson, PT, DPT [phone]   Alessandra BevelsJennifer M Treshawn Allen 04/25/2017, 9:41 AM

## 2017-04-25 NOTE — Care Management Note (Signed)
Case Management Note  Patient Details  Name: Deanna Owens MRN: [id number] Date of Birth: 16-Jun-1986  Subjective/Objective:                 DC to home with outpatient PT. Referral placed through Epic. Patient provided handout with contact information. AVS updated.    Action/Plan:   Expected Discharge Date:  04/25/17               Expected Discharge Plan:     In-House Referral:     Discharge planning Services     Post Acute Care Choice:    Choice offered to:     DME Arranged:    DME Agency:     HH Arranged:    HH Agency:     Status of Service:     If discussed at [employer] Meetings, dates discussed:    Additional Comments:  Lawerance SabalDebbie Bertin Inabinet, RN 04/25/2017, 2:42 PM

## 2017-04-25 NOTE — Discharge Summary (Signed)
Physician Discharge Summary  Deanna Sirenybia Rallis ZOX:[id number] DOB: Dec 05, 1986 DOA: 04/24/2017  PCP: [facility]  Admit date: 04/24/2017 Discharge date: 04/25/2017  Admitted From: Home  Disposition: Home   Recommendations for Outpatient Follow-up:  1. Follow up with PCP in 1 weeks 2. Follow up with neurologist in 1 month  3. Please obtain BMP/CBC in one week  Discharge Condition: STABLE  CODE STATUS: FULL  Diet recommendation: Heart Healthy  Brief/Interim Summary: HPI: Deanna Owens is a 31 y.o. left-hand dominant female with medical history significant of HTN, asthma, allergies, and tobacco/alcohol use; who presents with complaints of left-sided numbness and weakness.  Patient actually noted having a abnormal sensation on the left side of her tongue starting yesterday. Prior to onset of symptoms patient had been in a normal state of health. Reports "tongue felt oily", but initially just dismissed symptoms. However, this morning while brushing her teeth she noted that all trying to gargle and spit liquid running out of the left side of her mouth and that was unusual for her. Associated symptoms included feelings of numbness and weakness of the upper/ lower left side, mild balance disturbance, and intermittent hot flashes(new in last 3-4 weeks). Denies any recent illness, palpitations, chest pain, headache, change in vision, shortness of breath, falls, nausea, vomiting, traumas, neck pain, new medications, or history of MS/brain tumor/clotting disorder. She previously reported loss of feeling of lower body following epidural during childbirth requiring rehabilitation to walk. Patient admits to not taking blood pressure medications of lisinopril as She was seen by her PCP who noted significantly elevated blood pressure and due to symptoms recommended evaluation for which the [facility]. Admits to drinking anywhere from 3-4 glasses of wine and smoking half pack  cigarettes on a daily  basis.  ED Course: Upon admission into the emergency department patient was seen to be afebrile, with blood pressures elevated to 186/129, and all other vital signs within normal limits. Labs revealed mildly low potassium at 3.4. CT scan of the brain showed no acute signs of a stroke. UDS came back positive for cocaine. Due to the persistent symptoms patient was admitted for further workup. She was not a TPA candidate and was accepted for transfer to inpatient telemetry bed by  Glade LloydKshitiz Alekh, MD.   Hospital Course   Left-sided numbness and weakness: Acute - MRI MRA negative for acute CVA.  Neurology consulted and following, treating for migraine.  Symptoms resolved this morning.  Per neurology can be discharged home with outpatient neurology follow up.    New onset Migraine headache  -  The left sided symptoms may have been migraine aura related.  It has resolved.  The headache responded to migraine cocktail of meds. Pt given Rx for Maxalt MLT 10 to take prn for aura symptoms.    Hypertension - resume home lisinopril 20 mg.  DC IVF.   Polysubstance abuse - consult social worker for resources.  CIWA protocol in place.  HIV pending.    DVT prophylaxis: lovenox Code Status: full  Family Communication: no family present Disposition Plan: Home   Consultants:  neurology  Discharge Diagnoses:  Principal Problem:   Left sided numbness Active Problems:   Left-sided weakness   Polysubstance abuse   Hypertensive urgency   Hypokalemia    Discharge Instructions  Discharge Instructions    Increase activity slowly    Complete by:  As directed      Allergies as of 04/25/2017  Reactions   Tramadol Anaphylaxis      Medication List    STOP taking these medications   ibuprofen 400 MG tablet Commonly known as:  ADVIL,MOTRIN     TAKE these medications   cyproheptadine 4 MG tablet Commonly known as:  PERIACTIN Take 8 mg by mouth 3 (three) times daily  as needed for allergies.   FLOVENT IN Inhale into the lungs.   folic acid 1 MG tablet Commonly known as:  FOLVITE Take 1 tablet (1 mg total) by mouth daily. Start taking on:  04/26/2017   lisinopril 20 MG tablet Commonly known as:  PRINIVIL,ZESTRIL Take 1 tablet (20 mg total) by mouth daily. Start taking on:  04/26/2017   Melatonin 3 MG Tabs Take by mouth.   multivitamin with minerals Tabs tablet Take 1 tablet by mouth daily. Start taking on:  04/26/2017   rizatriptan 10 MG disintegrating tablet Commonly known as:  MAXALT-MLT Take 1 tablet (10 mg total) by mouth as needed for migraine. May repeat in 2 hours if needed   thiamine 100 MG tablet Take 1 tablet (100 mg total) by mouth daily. Start taking on:  04/26/2017      Follow-up Information    [facility]. Schedule an appointment as soon as possible for a visit in 1 week(s).   Specialty:  Pediatrics Contact information: [address] [place] [address] [phone]        [facility]. Schedule an appointment as soon as possible for a visit in 2 week(s).   Why:  Establish care Migraines Contact information: [address] [place], [address] [place] [address] [phone]         Allergies  Allergen Reactions  . Tramadol Anaphylaxis   Procedures/Studies: Dg Chest 2 View  Result Date: 04/25/2017 CLINICAL DATA:  Left-sided weakness EXAM: CHEST  2 VIEW COMPARISON:  12/21/2016 FINDINGS: The heart size and mediastinal contours are within normal limits. Both lungs are clear. The visualized skeletal structures are unremarkable. IMPRESSION: No active cardiopulmonary disease. Electronically Signed   By: Marlan Palau M.D.   On: 04/25/2017 08:45   Ct Head Wo Contrast  Result Date: 04/24/2017 CLINICAL DATA:  Numbness, weakness EXAM: CT HEAD WITHOUT CONTRAST TECHNIQUE: Contiguous axial images were obtained from the base of the skull through the vertex without  intravenous contrast. COMPARISON:  None. FINDINGS: Brain: No evidence of acute infarction, hemorrhage, hydrocephalus, extra-axial collection or mass lesion/mass effect. Vascular: No hyperdense vessel or unexpected calcification. Skull: Normal. Negative for fracture or focal lesion. Sinuses/Orbits: Partial opacification of the left maxillary sinus. The mastoid air cells are unopacified. Other: None. IMPRESSION: Normal head CT. Electronically Signed   By: Charline Bills M.D.   On: 04/24/2017 15:23   Mr [name] Neck W Wo Contrast  Result Date: 04/24/2017 CLINICAL DATA:  31 y/o  F; left-sided numbness and weakness. EXAM: MR HEAD WITHOUT CONTRAST MRA HEAD WITHOUT CONTRAST MRA OF THE NECK WITHOUT AND WITH CONTRAST TECHNIQUE: Multiplanar, multiecho pulse sequences of the brain and surrounding structures were obtained without intravenous contrast. Angiographic images of the neck were obtained using MRA technique without and with intravenous contrast. Angiographic images of the head were obtained using MRA technique without intravenous contrast. CONTRAST:  13mL MULTIHANCE GADOBENATE DIMEGLUMINE 529 MG/ML IV SOLN COMPARISON:  04/24/2017 CT of the head. FINDINGS: MR HEAD FINDINGS Brain: No acute infarction, hemorrhage, hydrocephalus, extra-axial collection or mass lesion. Few nonspecific punctate foci of T2 FLAIR hyperintensity are present in bifrontal  subcortical white matter and periventricular white matter. No white matter lesion is identified in basal ganglia, corpus callosum, brainstem, or cerebellum. Vascular: As below. Skull and upper cervical spine: Normal marrow signal. Sinuses/Orbits: Patchy ethmoid sinus mucosal thickening in bilateral maxillary sinus mucous retention cyst. Orbits are unremarkable. Other: Cavum septum pellucidum. MR CIRCLE OF WILLIS FINDINGS Anterior circulation: No large vessel occlusion, aneurysm, or significant stenosis is identified. Posterior circulation: No large vessel occlusion, aneurysm,  or significant stenosis is identified. Anatomic variant: Anterior communicating and right greater than left posterior communicating arteries are present. Other: Negative. MRA NECK FINDINGS Aortic arch: Patent.  Bovine arch, normal variant. Right common carotid artery: Patent. Right internal carotid artery: Patent. Short upper cervical segment of beaded irregularity (series 1801, image 106). Right vertebral artery: Patent. Left common carotid artery: Patent. Left Internal carotid artery: Patent. Short upper cervical segment of beaded irregularity (series 1801, image 101). Left Vertebral artery: Patent. No large vessel occlusion, aneurysm, or significant stenosis is identified. Other: Prominent bilateral upper cervical lymph nodes measuring up to 20 x 14 mm at the right level 2 station. IMPRESSION: 1. No acute intracranial abnormality. 2. Nonspecific T2 FLAIR hyperintense white matter foci unexpected for age may represent microvascular ischemic changes particularly in the setting of diabetes or hypertension. Possibly migraine headache. No specific findings of demyelination. 3. Ethmoid and maxillary sinus disease. 4. Patent circle of Willis. No large vessel occlusion, aneurysm, or significant stenosis is identified. 5. Patent carotid and vertebral arteries of the neck. No significant stenosis. 6. Bilateral upper cervical internal carotid artery short segments of non stenotic beaded irregularity probably represents sequelae of fibromuscular dysplasia. 7. Prominent bilateral upper cervical lymph nodes of uncertain significance. Electronically Signed   By: Mitzi Hansen M.D.   On: 04/24/2017 22:17   Mr Brain Wo Contrast  Result Date: 04/24/2017 CLINICAL DATA:  31 y/o  F; left-sided numbness and weakness. EXAM: MR HEAD WITHOUT CONTRAST MRA HEAD WITHOUT CONTRAST MRA OF THE NECK WITHOUT AND WITH CONTRAST TECHNIQUE: Multiplanar, multiecho pulse sequences of the brain and surrounding structures were obtained  without intravenous contrast. Angiographic images of the neck were obtained using MRA technique without and with intravenous contrast. Angiographic images of the head were obtained using MRA technique without intravenous contrast. CONTRAST:  13mL MULTIHANCE GADOBENATE DIMEGLUMINE 529 MG/ML IV SOLN COMPARISON:  04/24/2017 CT of the head. FINDINGS: MR HEAD FINDINGS Brain: No acute infarction, hemorrhage, hydrocephalus, extra-axial collection or mass lesion. Few nonspecific punctate foci of T2 FLAIR hyperintensity are present in bifrontal subcortical white matter and periventricular white matter. No white matter lesion is identified in basal ganglia, corpus callosum, brainstem, or cerebellum. Vascular: As below. Skull and upper cervical spine: Normal marrow signal. Sinuses/Orbits: Patchy ethmoid sinus mucosal thickening in bilateral maxillary sinus mucous retention cyst. Orbits are unremarkable. Other: Cavum septum pellucidum. MR CIRCLE OF WILLIS FINDINGS Anterior circulation: No large vessel occlusion, aneurysm, or significant stenosis is identified. Posterior circulation: No large vessel occlusion, aneurysm, or significant stenosis is identified. Anatomic variant: Anterior communicating and right greater than left posterior communicating arteries are present. Other: Negative. MRA NECK FINDINGS Aortic arch: Patent.  Bovine arch, normal variant. Right common carotid artery: Patent. Right internal carotid artery: Patent. Short upper cervical segment of beaded irregularity (series 1801, image 106). Right vertebral artery: Patent. Left common carotid artery: Patent. Left Internal carotid artery: Patent. Short upper cervical segment of beaded irregularity (series 1801, image 101). Left Vertebral artery: Patent. No large vessel occlusion, aneurysm, or significant stenosis is identified. Other:  Prominent bilateral upper cervical lymph nodes measuring up to 20 x 14 mm at the right level 2 station. IMPRESSION: 1. No acute  intracranial abnormality. 2. Nonspecific T2 FLAIR hyperintense white matter foci unexpected for age may represent microvascular ischemic changes particularly in the setting of diabetes or hypertension. Possibly migraine headache. No specific findings of demyelination. 3. Ethmoid and maxillary sinus disease. 4. Patent circle of Willis. No large vessel occlusion, aneurysm, or significant stenosis is identified. 5. Patent carotid and vertebral arteries of the neck. No significant stenosis. 6. Bilateral upper cervical internal carotid artery short segments of non stenotic beaded irregularity probably represents sequelae of fibromuscular dysplasia. 7. Prominent bilateral upper cervical lymph nodes of uncertain significance. Electronically Signed   By: Mitzi Hansen M.D.   On: 04/24/2017 22:17   Mr [name] Head/brain ZH Cm  Result Date: 04/24/2017 CLINICAL DATA:  31 y/o  F; left-sided numbness and weakness. EXAM: MR HEAD WITHOUT CONTRAST MRA HEAD WITHOUT CONTRAST MRA OF THE NECK WITHOUT AND WITH CONTRAST TECHNIQUE: Multiplanar, multiecho pulse sequences of the brain and surrounding structures were obtained without intravenous contrast. Angiographic images of the neck were obtained using MRA technique without and with intravenous contrast. Angiographic images of the head were obtained using MRA technique without intravenous contrast. CONTRAST:  13mL MULTIHANCE GADOBENATE DIMEGLUMINE 529 MG/ML IV SOLN COMPARISON:  04/24/2017 CT of the head. FINDINGS: MR HEAD FINDINGS Brain: No acute infarction, hemorrhage, hydrocephalus, extra-axial collection or mass lesion. Few nonspecific punctate foci of T2 FLAIR hyperintensity are present in bifrontal subcortical white matter and periventricular white matter. No white matter lesion is identified in basal ganglia, corpus callosum, brainstem, or cerebellum. Vascular: As below. Skull and upper cervical spine: Normal marrow signal. Sinuses/Orbits: Patchy ethmoid sinus mucosal  thickening in bilateral maxillary sinus mucous retention cyst. Orbits are unremarkable. Other: Cavum septum pellucidum. MR CIRCLE OF WILLIS FINDINGS Anterior circulation: No large vessel occlusion, aneurysm, or significant stenosis is identified. Posterior circulation: No large vessel occlusion, aneurysm, or significant stenosis is identified. Anatomic variant: Anterior communicating and right greater than left posterior communicating arteries are present. Other: Negative. MRA NECK FINDINGS Aortic arch: Patent.  Bovine arch, normal variant. Right common carotid artery: Patent. Right internal carotid artery: Patent. Short upper cervical segment of beaded irregularity (series 1801, image 106). Right vertebral artery: Patent. Left common carotid artery: Patent. Left Internal carotid artery: Patent. Short upper cervical segment of beaded irregularity (series 1801, image 101). Left Vertebral artery: Patent. No large vessel occlusion, aneurysm, or significant stenosis is identified. Other: Prominent bilateral upper cervical lymph nodes measuring up to 20 x 14 mm at the right level 2 station. IMPRESSION: 1. No acute intracranial abnormality. 2. Nonspecific T2 FLAIR hyperintense white matter foci unexpected for age may represent microvascular ischemic changes particularly in the setting of diabetes or hypertension. Possibly migraine headache. No specific findings of demyelination. 3. Ethmoid and maxillary sinus disease. 4. Patent circle of Willis. No large vessel occlusion, aneurysm, or significant stenosis is identified. 5. Patent carotid and vertebral arteries of the neck. No significant stenosis. 6. Bilateral upper cervical internal carotid artery short segments of non stenotic beaded irregularity probably represents sequelae of fibromuscular dysplasia. 7. Prominent bilateral upper cervical lymph nodes of uncertain significance. Electronically Signed   By: Mitzi Hansen M.D.   On: 04/24/2017 22:17    (Echo,  Carotid, EGD, Colonoscopy, ERCP)    Subjective: Pt says that symptoms have resolved.   Discharge Exam: Vitals:   04/25/17 0730 04/25/17 0931  BP: (!) 151/97 Marland Kitchen)  144/103  Pulse: 90 91  Resp: 18 20  Temp: 98.7 F (37.1 C) 98.6 F (37 C)   Vitals:   04/25/17 0330 04/25/17 0531 04/25/17 0730 04/25/17 0931  BP: (!) 164/113 (!) 152/110 (!) 151/97 (!) 144/103  Pulse: 76 87 90 91  Resp: 16 16 18 20   Temp: 98.4 F (36.9 C) 98.3 F (36.8 C) 98.7 F (37.1 C) 98.6 F (37 C)  TempSrc: Oral Oral Oral Oral  SpO2: 97% 99% 100% 99%  Weight:      Height:       General exam: awake, alert, NAD. Cooperative.   Respiratory system: Clear. No increased work of breathing. Cardiovascular system: S1 & S2 heard, RRR. No JVD, murmurs, gallops, clicks or pedal edema. Gastrointestinal system: Abdomen is nondistended, soft and nontender. Normal bowel sounds heard. Central nervous system: Alert and oriented. No focal neurological deficits. Extremities: no CCE.  The results of significant diagnostics from this hospitalization (including imaging, microbiology, ancillary and laboratory) are listed below for reference.     Microbiology: Recent Results (from the past 240 hour(s))  Culture, blood (routine x 2)     Status: None (Preliminary result)   Collection Time: 04/24/17  9:58 PM  Result Value Ref Range Status   Specimen Description BLOOD LEFT ANTECUBITAL  Final   Special Requests   Final    BOTTLES DRAWN AEROBIC AND ANAEROBIC Blood Culture adequate volume   Culture NO GROWTH < 24 HOURS  Final   Report Status PENDING  Incomplete  Culture, blood (routine x 2)     Status: None (Preliminary result)   Collection Time: 04/24/17 10:00 PM  Result Value Ref Range Status   Specimen Description BLOOD RIGHT ANTECUBITAL  Final   Special Requests   Final    BOTTLES DRAWN AEROBIC AND ANAEROBIC Blood Culture adequate volume   Culture NO GROWTH < 24 HOURS  Final   Report Status PENDING  Incomplete      Labs: BNP (last 3 results) No results for input(s): BNP in the last 8760 hours. Basic Metabolic Panel:  Recent Labs Lab 04/24/17 1527 04/24/17 2158  NA 140 138  K 3.4* 3.1*  CL 107 108  CO2 23 20*  GLUCOSE 93 90  BUN 13 7  CREATININE 0.72 0.57  CALCIUM 9.0 9.0   Liver Function Tests:  Recent Labs Lab 04/24/17 1527  AST 24  ALT 21  ALKPHOS 57  BILITOT 0.8  PROT 7.4  ALBUMIN 4.3   No results for input(s): LIPASE, AMYLASE in the last 168 hours. No results for input(s): AMMONIA in the last 168 hours. CBC:  Recent Labs Lab 04/24/17 1527 04/24/17 2158  WBC 4.7 5.2  NEUTROABS 2.2 2.3  HGB 14.1 14.9  HCT 41.1 43.7  MCV 92.8 93.4  PLT 224 206   Cardiac Enzymes:  Recent Labs Lab 04/24/17 1536 04/24/17 2158 04/25/17 0251  TROPONINI <0.03 <0.03 <0.03   BNP: Invalid input(s): POCBNP CBG:  Recent Labs Lab 04/24/17 1451  GLUCAP 96   D-Dimer No results for input(s): DDIMER in the last 72 hours. Hgb A1c No results for input(s): HGBA1C in the last 72 hours. Lipid Profile  Recent Labs  04/25/17 0251  CHOL 174  HDL 37*  LDLCALC 110*  TRIG 136  CHOLHDL 4.7   Thyroid function studies  Recent Labs  04/25/17 0251  TSH 4.576*   Anemia work up No results for input(s): VITAMINB12, FOLATE, FERRITIN, TIBC, IRON, RETICCTPCT in the last 72 hours. Urinalysis  Component Value Date/Time   COLORURINE YELLOW 04/24/2017 1715   APPEARANCEUR CLEAR 04/24/2017 1715   LABSPEC 1.014 04/24/2017 1715   PHURINE 7.0 04/24/2017 1715   GLUCOSEU NEGATIVE 04/24/2017 1715   HGBUR NEGATIVE 04/24/2017 1715   BILIRUBINUR NEGATIVE 04/24/2017 1715   KETONESUR NEGATIVE 04/24/2017 1715   PROTEINUR NEGATIVE 04/24/2017 1715   NITRITE NEGATIVE 04/24/2017 1715   LEUKOCYTESUR TRACE (A) 04/24/2017 1715   Sepsis Labs Invalid input(s): PROCALCITONIN,  WBC,  LACTICIDVEN Microbiology Recent Results (from the past 240 hour(s))  Culture, blood (routine x 2)     Status: None  (Preliminary result)   Collection Time: 04/24/17  9:58 PM  Result Value Ref Range Status   Specimen Description BLOOD LEFT ANTECUBITAL  Final   Special Requests   Final    BOTTLES DRAWN AEROBIC AND ANAEROBIC Blood Culture adequate volume   Culture NO GROWTH < 24 HOURS  Final   Report Status PENDING  Incomplete  Culture, blood (routine x 2)     Status: None (Preliminary result)   Collection Time: 04/24/17 10:00 PM  Result Value Ref Range Status   Specimen Description BLOOD RIGHT ANTECUBITAL  Final   Special Requests   Final    BOTTLES DRAWN AEROBIC AND ANAEROBIC Blood Culture adequate volume   Culture NO GROWTH < 24 HOURS  Final   Report Status PENDING  Incomplete   Time coordinating discharge: 31 minutes  SIGNED:  Standley Dakins, MD  [facility] 04/25/2017, 12:08 PM Pager [phone]  If 7PM-7AM, please contact night-coverage [email or web address] Password [email or web address]

## 2017-04-25 NOTE — Progress Notes (Signed)
Subjective: Interval History:  Headache is 80% gone.  Her left side weakness and numbness has resolved. She feels like her left face may have some numbness or weakness still.   BP is high.    She started getting headaches about a month ago.  They are left frontotemporal throbbing pains of 10/10 severity associated with nausea, photophobia, and aggravated by movement.  It lasts 1-2 days with Ibuprofen intake.    Objective: Vital signs in last 24 hours: Temp:  [98 F (36.7 C)-99.2 F (37.3 C)] 98.6 F (37 C) (05/12 0931) Pulse Rate:  [74-91] 91 (05/12 0931) Resp:  [14-20] 20 (05/12 0931) BP: (138-187)/(89-129) 144/103 (05/12 0931) SpO2:  [97 %-100 %] 99 % (05/12 0931) Weight:  [66.2 kg (146 lb)-66.9 kg (147 lb 6.4 oz)] 66.9 kg (147 lb 6.4 oz) (05/11 2328)  Intake/Output from previous day: 05/11 0701 - 05/12 0700 In: 496.3 [I.V.:496.3] Out: -  Intake/Output this shift: No intake/output data recorded. Nutritional status: Diet Heart Room service appropriate? Yes; Fluid consistency: Thin  Neurologic Exam:  Awake, alert, fully oriented. Very mild facial asymmetry, but not Bell's palsy pattern. ?congenital Strength 5/5 BUE and BLE Sensory - intact. Coordination - intact    Lab Results:  Recent Labs  04/24/17 1527 04/24/17 2158  WBC 4.7 5.2  HGB 14.1 14.9  HCT 41.1 43.7  PLT 224 206  NA 140 138  K 3.4* 3.1*  CL 107 108  CO2 23 20*  GLUCOSE 93 90  BUN 13 7  CREATININE 0.72 0.57  CALCIUM 9.0 9.0   Lipid Panel  Recent Labs  04/25/17 0251  CHOL 174  TRIG 136  HDL 37*  CHOLHDL 4.7  VLDL 27  LDLCALC 161110*    Studies/Results: Dg Chest 2 View  Result Date: 04/25/2017 CLINICAL DATA:  Left-sided weakness EXAM: CHEST  2 VIEW COMPARISON:  12/21/2016 FINDINGS: The heart size and mediastinal contours are within normal limits. Both lungs are clear. The visualized skeletal structures are unremarkable. IMPRESSION: No active cardiopulmonary disease. Electronically Signed    By: Marlan Palauharles  Clark M.D.   On: 04/25/2017 08:45   Ct Head Wo Contrast  Result Date: 04/24/2017 CLINICAL DATA:  Numbness, weakness EXAM: CT HEAD WITHOUT CONTRAST TECHNIQUE: Contiguous axial images were obtained from the base of the skull through the vertex without intravenous contrast. COMPARISON:  None. FINDINGS: Brain: No evidence of acute infarction, hemorrhage, hydrocephalus, extra-axial collection or mass lesion/mass effect. Vascular: No hyperdense vessel or unexpected calcification. Skull: Normal. Negative for fracture or focal lesion. Sinuses/Orbits: Partial opacification of the left maxillary sinus. The mastoid air cells are unopacified. Other: None. IMPRESSION: Normal head CT. Electronically Signed   By: Charline BillsSriyesh  Krishnan M.D.   On: 04/24/2017 15:23   Mr Maxine GlennMra Neck W Wo Contrast  Result Date: 04/24/2017 CLINICAL DATA:  31 y/o  F; left-sided numbness and weakness. EXAM: MR HEAD WITHOUT CONTRAST MRA HEAD WITHOUT CONTRAST MRA OF THE NECK WITHOUT AND WITH CONTRAST TECHNIQUE: Multiplanar, multiecho pulse sequences of the brain and surrounding structures were obtained without intravenous contrast. Angiographic images of the neck were obtained using MRA technique without and with intravenous contrast. Angiographic images of the head were obtained using MRA technique without intravenous contrast. CONTRAST:  13mL MULTIHANCE GADOBENATE DIMEGLUMINE 529 MG/ML IV SOLN COMPARISON:  04/24/2017 CT of the head. FINDINGS: MR HEAD FINDINGS Brain: No acute infarction, hemorrhage, hydrocephalus, extra-axial collection or mass lesion. Few nonspecific punctate foci of T2 FLAIR hyperintensity are present in bifrontal subcortical white matter and periventricular white  matter. No white matter lesion is identified in basal ganglia, corpus callosum, brainstem, or cerebellum. Vascular: As below. Skull and upper cervical spine: Normal marrow signal. Sinuses/Orbits: Patchy ethmoid sinus mucosal thickening in bilateral maxillary sinus  mucous retention cyst. Orbits are unremarkable. Other: Cavum septum pellucidum. MR CIRCLE OF WILLIS FINDINGS Anterior circulation: No large vessel occlusion, aneurysm, or significant stenosis is identified. Posterior circulation: No large vessel occlusion, aneurysm, or significant stenosis is identified. Anatomic variant: Anterior communicating and right greater than left posterior communicating arteries are present. Other: Negative. MRA NECK FINDINGS Aortic arch: Patent.  Bovine arch, normal variant. Right common carotid artery: Patent. Right internal carotid artery: Patent. Short upper cervical segment of beaded irregularity (series 1801, image 106). Right vertebral artery: Patent. Left common carotid artery: Patent. Left Internal carotid artery: Patent. Short upper cervical segment of beaded irregularity (series 1801, image 101). Left Vertebral artery: Patent. No large vessel occlusion, aneurysm, or significant stenosis is identified. Other: Prominent bilateral upper cervical lymph nodes measuring up to 20 x 14 mm at the right level 2 station. IMPRESSION: 1. No acute intracranial abnormality. 2. Nonspecific T2 FLAIR hyperintense white matter foci unexpected for age may represent microvascular ischemic changes particularly in the setting of diabetes or hypertension. Possibly migraine headache. No specific findings of demyelination. 3. Ethmoid and maxillary sinus disease. 4. Patent circle of Willis. No large vessel occlusion, aneurysm, or significant stenosis is identified. 5. Patent carotid and vertebral arteries of the neck. No significant stenosis. 6. Bilateral upper cervical internal carotid artery short segments of non stenotic beaded irregularity probably represents sequelae of fibromuscular dysplasia. 7. Prominent bilateral upper cervical lymph nodes of uncertain significance. Electronically Signed   By: Mitzi Hansen M.D.   On: 04/24/2017 22:17   Mr Brain Wo Contrast  Result Date:  04/24/2017 CLINICAL DATA:  31 y/o  F; left-sided numbness and weakness. EXAM: MR HEAD WITHOUT CONTRAST MRA HEAD WITHOUT CONTRAST MRA OF THE NECK WITHOUT AND WITH CONTRAST TECHNIQUE: Multiplanar, multiecho pulse sequences of the brain and surrounding structures were obtained without intravenous contrast. Angiographic images of the neck were obtained using MRA technique without and with intravenous contrast. Angiographic images of the head were obtained using MRA technique without intravenous contrast. CONTRAST:  13mL MULTIHANCE GADOBENATE DIMEGLUMINE 529 MG/ML IV SOLN COMPARISON:  04/24/2017 CT of the head. FINDINGS: MR HEAD FINDINGS Brain: No acute infarction, hemorrhage, hydrocephalus, extra-axial collection or mass lesion. Few nonspecific punctate foci of T2 FLAIR hyperintensity are present in bifrontal subcortical white matter and periventricular white matter. No white matter lesion is identified in basal ganglia, corpus callosum, brainstem, or cerebellum. Vascular: As below. Skull and upper cervical spine: Normal marrow signal. Sinuses/Orbits: Patchy ethmoid sinus mucosal thickening in bilateral maxillary sinus mucous retention cyst. Orbits are unremarkable. Other: Cavum septum pellucidum. MR CIRCLE OF WILLIS FINDINGS Anterior circulation: No large vessel occlusion, aneurysm, or significant stenosis is identified. Posterior circulation: No large vessel occlusion, aneurysm, or significant stenosis is identified. Anatomic variant: Anterior communicating and right greater than left posterior communicating arteries are present. Other: Negative. MRA NECK FINDINGS Aortic arch: Patent.  Bovine arch, normal variant. Right common carotid artery: Patent. Right internal carotid artery: Patent. Short upper cervical segment of beaded irregularity (series 1801, image 106). Right vertebral artery: Patent. Left common carotid artery: Patent. Left Internal carotid artery: Patent. Short upper cervical segment of beaded  irregularity (series 1801, image 101). Left Vertebral artery: Patent. No large vessel occlusion, aneurysm, or significant stenosis is identified. Other: Prominent bilateral upper cervical lymph  nodes measuring up to 20 x 14 mm at the right level 2 station. IMPRESSION: 1. No acute intracranial abnormality. 2. Nonspecific T2 FLAIR hyperintense white matter foci unexpected for age may represent microvascular ischemic changes particularly in the setting of diabetes or hypertension. Possibly migraine headache. No specific findings of demyelination. 3. Ethmoid and maxillary sinus disease. 4. Patent circle of Willis. No large vessel occlusion, aneurysm, or significant stenosis is identified. 5. Patent carotid and vertebral arteries of the neck. No significant stenosis. 6. Bilateral upper cervical internal carotid artery short segments of non stenotic beaded irregularity probably represents sequelae of fibromuscular dysplasia. 7. Prominent bilateral upper cervical lymph nodes of uncertain significance. Electronically Signed   By: Mitzi Hansen M.D.   On: 04/24/2017 22:17   Mr [name] Head/brain ZO Cm  Result Date: 04/24/2017 CLINICAL DATA:  31 y/o  F; left-sided numbness and weakness. EXAM: MR HEAD WITHOUT CONTRAST MRA HEAD WITHOUT CONTRAST MRA OF THE NECK WITHOUT AND WITH CONTRAST TECHNIQUE: Multiplanar, multiecho pulse sequences of the brain and surrounding structures were obtained without intravenous contrast. Angiographic images of the neck were obtained using MRA technique without and with intravenous contrast. Angiographic images of the head were obtained using MRA technique without intravenous contrast. CONTRAST:  13mL MULTIHANCE GADOBENATE DIMEGLUMINE 529 MG/ML IV SOLN COMPARISON:  04/24/2017 CT of the head. FINDINGS: MR HEAD FINDINGS Brain: No acute infarction, hemorrhage, hydrocephalus, extra-axial collection or mass lesion. Few nonspecific punctate foci of T2 FLAIR hyperintensity are present in  bifrontal subcortical white matter and periventricular white matter. No white matter lesion is identified in basal ganglia, corpus callosum, brainstem, or cerebellum. Vascular: As below. Skull and upper cervical spine: Normal marrow signal. Sinuses/Orbits: Patchy ethmoid sinus mucosal thickening in bilateral maxillary sinus mucous retention cyst. Orbits are unremarkable. Other: Cavum septum pellucidum. MR CIRCLE OF WILLIS FINDINGS Anterior circulation: No large vessel occlusion, aneurysm, or significant stenosis is identified. Posterior circulation: No large vessel occlusion, aneurysm, or significant stenosis is identified. Anatomic variant: Anterior communicating and right greater than left posterior communicating arteries are present. Other: Negative. MRA NECK FINDINGS Aortic arch: Patent.  Bovine arch, normal variant. Right common carotid artery: Patent. Right internal carotid artery: Patent. Short upper cervical segment of beaded irregularity (series 1801, image 106). Right vertebral artery: Patent. Left common carotid artery: Patent. Left Internal carotid artery: Patent. Short upper cervical segment of beaded irregularity (series 1801, image 101). Left Vertebral artery: Patent. No large vessel occlusion, aneurysm, or significant stenosis is identified. Other: Prominent bilateral upper cervical lymph nodes measuring up to 20 x 14 mm at the right level 2 station. IMPRESSION: 1. No acute intracranial abnormality. 2. Nonspecific T2 FLAIR hyperintense white matter foci unexpected for age may represent microvascular ischemic changes particularly in the setting of diabetes or hypertension. Possibly migraine headache. No specific findings of demyelination. 3. Ethmoid and maxillary sinus disease. 4. Patent circle of Willis. No large vessel occlusion, aneurysm, or significant stenosis is identified. 5. Patent carotid and vertebral arteries of the neck. No significant stenosis. 6. Bilateral upper cervical internal carotid  artery short segments of non stenotic beaded irregularity probably represents sequelae of fibromuscular dysplasia. 7. Prominent bilateral upper cervical lymph nodes of uncertain significance. Electronically Signed   By: Mitzi Hansen M.D.   On: 04/24/2017 22:17    Medications:  Scheduled: . aspirin  300 mg Rectal Daily   Or  . aspirin  325 mg Oral Daily  . enoxaparin (LOVENOX) injection  40 mg Subcutaneous Q24H  . folic acid  1 mg Oral Daily  . [START ON 04/26/2017] lisinopril  20 mg Oral Daily  . multivitamin with minerals  1 tablet Oral Daily  . nicotine  21 mg Transdermal Daily  . thiamine  100 mg Oral Daily   Or  . thiamine  100 mg Intravenous Daily    Assessment/Plan:  New onset migraine headaches with or without aura.  The left sided symptoms may have been migraine aura related.  It has resolved.  The headache responded to migraine cocktail of meds.   I recommend Maxalt MLT 10 mg prn at onset of migraine for future abortive therapy.  She may be discharged from neuro point of view, but should have neurologist f/u as outpatient.    LOS: 1 day   Weston Settle, MD 04/25/2017  9:58 AM

## 2017-04-25 NOTE — Progress Notes (Addendum)
PROGRESS NOTE    Deanna Owens  ONG:[id number]  DOB: 04/13/1986  DOA: 04/24/2017 PCP: [facility]:   Hospital course: Deanna Owens is a 31 y.o. left-hand dominant female with medical history significant of HTN, asthma, allergies, and tobacco/alcohol use; who presents with complaints of left-sided numbness and weakness.   Assessment & Plan:   Left-sided numbness and weakness: Acute - MRI MRA negative for acute CVA.  Neurology consulted and following, treating for migraine.  Symptoms resolved this morning.    Uncontrolled hypertension - resume home lisinopril.  DC IVF.   Polysubstance abuse - consult social worker for resources.  CIWA protocol in place.  HIV pending.    DVT prophylaxis: lovenox Code Status: full  Family Communication: no family present Disposition Plan: TBD   Consultants:  neurology  Subjective: Pt says that symptoms resolved.    Objective: Vitals:   04/25/17 0130 04/25/17 0330 04/25/17 0531 04/25/17 0730  BP: 138/89 (!) 164/113 (!) 152/110 (!) 151/97  Pulse: 84 76 87 90  Resp: 16 16 16 18   Temp:  98.4 F (36.9 C) 98.3 F (36.8 C) 98.7 F (37.1 C)  TempSrc:  Oral Oral Oral  SpO2: 98% 97% 99% 100%  Weight:      Height:        Intake/Output Summary (Last 24 hours) at 04/25/17 0840 Last data filed at 04/25/17 0636  Gross per 24 hour  Intake           496.25 ml  Output                0 ml  Net           496.25 ml   Filed Weights   04/24/17 1444 04/24/17 2328  Weight: 66.2 kg (146 lb) 66.9 kg (147 lb 6.4 oz)    Exam:  General exam: awake, alert, NAD. Cooperative.   Respiratory system: Clear. No increased work of breathing. Cardiovascular system: S1 & S2 heard, RRR. No JVD, murmurs, gallops, clicks or pedal edema. Gastrointestinal system: Abdomen is nondistended, soft and nontender. Normal bowel sounds heard. Central nervous system: Alert and oriented. No focal neurological  deficits. Extremities: no CCE.  Data Reviewed: Basic Metabolic Panel:  Recent Labs Lab 04/24/17 1527 04/24/17 2158  NA 140 138  K 3.4* 3.1*  CL 107 108  CO2 23 20*  GLUCOSE 93 90  BUN 13 7  CREATININE 0.72 0.57  CALCIUM 9.0 9.0   Liver Function Tests:  Recent Labs Lab 04/24/17 1527  AST 24  ALT 21  ALKPHOS 57  BILITOT 0.8  PROT 7.4  ALBUMIN 4.3   No results for input(s): LIPASE, AMYLASE in the last 168 hours. No results for input(s): AMMONIA in the last 168 hours. CBC:  Recent Labs Lab 04/24/17 1527 04/24/17 2158  WBC 4.7 5.2  NEUTROABS 2.2 2.3  HGB 14.1 14.9  HCT 41.1 43.7  MCV 92.8 93.4  PLT 224 206   Cardiac Enzymes:  Recent Labs Lab 04/24/17 1536 04/24/17 2158 04/25/17 0251  TROPONINI <0.03 <0.03 <0.03   CBG (last 3)   Recent Labs  04/24/17 1451  GLUCAP 96   No results found for this or any previous visit (from the past 240 hour(s)).   Studies: Ct Head Wo Contrast  Result Date: 04/24/2017 CLINICAL DATA:  Numbness, weakness EXAM: CT HEAD WITHOUT CONTRAST TECHNIQUE: Contiguous axial images were obtained from the base of the skull through the vertex without intravenous contrast. COMPARISON:  None.  FINDINGS: Brain: No evidence of acute infarction, hemorrhage, hydrocephalus, extra-axial collection or mass lesion/mass effect. Vascular: No hyperdense vessel or unexpected calcification. Skull: Normal. Negative for fracture or focal lesion. Sinuses/Orbits: Partial opacification of the left maxillary sinus. The mastoid air cells are unopacified. Other: None. IMPRESSION: Normal head CT. Electronically Signed   By: Charline Bills M.D.   On: 04/24/2017 15:23   Mr Deanna Owens Neck W Wo Contrast  Result Date: 04/24/2017 CLINICAL DATA:  31 y/o  F; left-sided numbness and weakness. EXAM: MR HEAD WITHOUT CONTRAST MRA HEAD WITHOUT CONTRAST MRA OF THE NECK WITHOUT AND WITH CONTRAST TECHNIQUE: Multiplanar, multiecho pulse sequences of the brain and surrounding  structures were obtained without intravenous contrast. Angiographic images of the neck were obtained using MRA technique without and with intravenous contrast. Angiographic images of the head were obtained using MRA technique without intravenous contrast. CONTRAST:  13mL MULTIHANCE GADOBENATE DIMEGLUMINE 529 MG/ML IV SOLN COMPARISON:  04/24/2017 CT of the head. FINDINGS: MR HEAD FINDINGS Brain: No acute infarction, hemorrhage, hydrocephalus, extra-axial collection or mass lesion. Few nonspecific punctate foci of T2 FLAIR hyperintensity are present in bifrontal subcortical white matter and periventricular white matter. No white matter lesion is identified in basal ganglia, corpus callosum, brainstem, or cerebellum. Vascular: As below. Skull and upper cervical spine: Normal marrow signal. Sinuses/Orbits: Patchy ethmoid sinus mucosal thickening in bilateral maxillary sinus mucous retention cyst. Orbits are unremarkable. Other: Cavum septum pellucidum. MR CIRCLE OF WILLIS FINDINGS Anterior circulation: No large vessel occlusion, aneurysm, or significant stenosis is identified. Posterior circulation: No large vessel occlusion, aneurysm, or significant stenosis is identified. Anatomic variant: Anterior communicating and right greater than left posterior communicating arteries are present. Other: Negative. MRA NECK FINDINGS Aortic arch: Patent.  Bovine arch, normal variant. Right common carotid artery: Patent. Right internal carotid artery: Patent. Short upper cervical segment of beaded irregularity (series 1801, image 106). Right vertebral artery: Patent. Left common carotid artery: Patent. Left Internal carotid artery: Patent. Short upper cervical segment of beaded irregularity (series 1801, image 101). Left Vertebral artery: Patent. No large vessel occlusion, aneurysm, or significant stenosis is identified. Other: Prominent bilateral upper cervical lymph nodes measuring up to 20 x 14 mm at the right level 2 station.  IMPRESSION: 1. No acute intracranial abnormality. 2. Nonspecific T2 FLAIR hyperintense white matter foci unexpected for age may represent microvascular ischemic changes particularly in the setting of diabetes or hypertension. Possibly migraine headache. No specific findings of demyelination. 3. Ethmoid and maxillary sinus disease. 4. Patent circle of Willis. No large vessel occlusion, aneurysm, or significant stenosis is identified. 5. Patent carotid and vertebral arteries of the neck. No significant stenosis. 6. Bilateral upper cervical internal carotid artery short segments of non stenotic beaded irregularity probably represents sequelae of fibromuscular dysplasia. 7. Prominent bilateral upper cervical lymph nodes of uncertain significance. Electronically Signed   By: Mitzi Hansen M.D.   On: 04/24/2017 22:17   Mr Brain Wo Contrast  Result Date: 04/24/2017 CLINICAL DATA:  31 y/o  F; left-sided numbness and weakness. EXAM: MR HEAD WITHOUT CONTRAST MRA HEAD WITHOUT CONTRAST MRA OF THE NECK WITHOUT AND WITH CONTRAST TECHNIQUE: Multiplanar, multiecho pulse sequences of the brain and surrounding structures were obtained without intravenous contrast. Angiographic images of the neck were obtained using MRA technique without and with intravenous contrast. Angiographic images of the head were obtained using MRA technique without intravenous contrast. CONTRAST:  13mL MULTIHANCE GADOBENATE DIMEGLUMINE 529 MG/ML IV SOLN COMPARISON:  04/24/2017 CT of the head. FINDINGS: MR HEAD FINDINGS  Brain: No acute infarction, hemorrhage, hydrocephalus, extra-axial collection or mass lesion. Few nonspecific punctate foci of T2 FLAIR hyperintensity are present in bifrontal subcortical white matter and periventricular white matter. No white matter lesion is identified in basal ganglia, corpus callosum, brainstem, or cerebellum. Vascular: As below. Skull and upper cervical spine: Normal marrow signal. Sinuses/Orbits: Patchy  ethmoid sinus mucosal thickening in bilateral maxillary sinus mucous retention cyst. Orbits are unremarkable. Other: Cavum septum pellucidum. MR CIRCLE OF WILLIS FINDINGS Anterior circulation: No large vessel occlusion, aneurysm, or significant stenosis is identified. Posterior circulation: No large vessel occlusion, aneurysm, or significant stenosis is identified. Anatomic variant: Anterior communicating and right greater than left posterior communicating arteries are present. Other: Negative. MRA NECK FINDINGS Aortic arch: Patent.  Bovine arch, normal variant. Right common carotid artery: Patent. Right internal carotid artery: Patent. Short upper cervical segment of beaded irregularity (series 1801, image 106). Right vertebral artery: Patent. Left common carotid artery: Patent. Left Internal carotid artery: Patent. Short upper cervical segment of beaded irregularity (series 1801, image 101). Left Vertebral artery: Patent. No large vessel occlusion, aneurysm, or significant stenosis is identified. Other: Prominent bilateral upper cervical lymph nodes measuring up to 20 x 14 mm at the right level 2 station. IMPRESSION: 1. No acute intracranial abnormality. 2. Nonspecific T2 FLAIR hyperintense white matter foci unexpected for age may represent microvascular ischemic changes particularly in the setting of diabetes or hypertension. Possibly migraine headache. No specific findings of demyelination. 3. Ethmoid and maxillary sinus disease. 4. Patent circle of Willis. No large vessel occlusion, aneurysm, or significant stenosis is identified. 5. Patent carotid and vertebral arteries of the neck. No significant stenosis. 6. Bilateral upper cervical internal carotid artery short segments of non stenotic beaded irregularity probably represents sequelae of fibromuscular dysplasia. 7. Prominent bilateral upper cervical lymph nodes of uncertain significance. Electronically Signed   By: Mitzi HansenLance  Furusawa-Stratton M.D.   On:  04/24/2017 22:17   Mr Deanna GlennMra Head/brain AVWo Cm  Result Date: 04/24/2017 CLINICAL DATA:  31 y/o  F; left-sided numbness and weakness. EXAM: MR HEAD WITHOUT CONTRAST MRA HEAD WITHOUT CONTRAST MRA OF THE NECK WITHOUT AND WITH CONTRAST TECHNIQUE: Multiplanar, multiecho pulse sequences of the brain and surrounding structures were obtained without intravenous contrast. Angiographic images of the neck were obtained using MRA technique without and with intravenous contrast. Angiographic images of the head were obtained using MRA technique without intravenous contrast. CONTRAST:  13mL MULTIHANCE GADOBENATE DIMEGLUMINE 529 MG/ML IV SOLN COMPARISON:  04/24/2017 CT of the head. FINDINGS: MR HEAD FINDINGS Brain: No acute infarction, hemorrhage, hydrocephalus, extra-axial collection or mass lesion. Few nonspecific punctate foci of T2 FLAIR hyperintensity are present in bifrontal subcortical white matter and periventricular white matter. No white matter lesion is identified in basal ganglia, corpus callosum, brainstem, or cerebellum. Vascular: As below. Skull and upper cervical spine: Normal marrow signal. Sinuses/Orbits: Patchy ethmoid sinus mucosal thickening in bilateral maxillary sinus mucous retention cyst. Orbits are unremarkable. Other: Cavum septum pellucidum. MR CIRCLE OF WILLIS FINDINGS Anterior circulation: No large vessel occlusion, aneurysm, or significant stenosis is identified. Posterior circulation: No large vessel occlusion, aneurysm, or significant stenosis is identified. Anatomic variant: Anterior communicating and right greater than left posterior communicating arteries are present. Other: Negative. MRA NECK FINDINGS Aortic arch: Patent.  Bovine arch, normal variant. Right common carotid artery: Patent. Right internal carotid artery: Patent. Short upper cervical segment of beaded irregularity (series 1801, image 106). Right vertebral artery: Patent. Left common carotid artery: Patent. Left Internal carotid  artery: Patent. Short  upper cervical segment of beaded irregularity (series 1801, image 101). Left Vertebral artery: Patent. No large vessel occlusion, aneurysm, or significant stenosis is identified. Other: Prominent bilateral upper cervical lymph nodes measuring up to 20 x 14 mm at the right level 2 station. IMPRESSION: 1. No acute intracranial abnormality. 2. Nonspecific T2 FLAIR hyperintense white matter foci unexpected for age may represent microvascular ischemic changes particularly in the setting of diabetes or hypertension. Possibly migraine headache. No specific findings of demyelination. 3. Ethmoid and maxillary sinus disease. 4. Patent circle of Willis. No large vessel occlusion, aneurysm, or significant stenosis is identified. 5. Patent carotid and vertebral arteries of the neck. No significant stenosis. 6. Bilateral upper cervical internal carotid artery short segments of non stenotic beaded irregularity probably represents sequelae of fibromuscular dysplasia. 7. Prominent bilateral upper cervical lymph nodes of uncertain significance. Electronically Signed   By: Mitzi Hansen M.D.   On: 04/24/2017 22:17     Scheduled Meds: . amLODipine  5 mg Oral Daily  . aspirin  300 mg Rectal Daily   Or  . aspirin  325 mg Oral Daily  . enoxaparin (LOVENOX) injection  40 mg Subcutaneous Q24H  . folic acid  1 mg Oral Daily  . multivitamin with minerals  1 tablet Oral Daily  . nicotine  21 mg Transdermal Daily  . potassium chloride  40 mEq Oral Once  . thiamine  100 mg Oral Daily   Or  . thiamine  100 mg Intravenous Daily   Continuous Infusions:  Principal Problem:   Left sided numbness Active Problems:   Left-sided weakness   Polysubstance abuse   Hypertensive urgency   Hypokalemia   Time spent:   Standley Dakins, MD, FAAFP [facility] Pager [phone]  If 7PM-7AM, please contact night-coverage [email or web address] Password [email or web address] 04/25/2017, 8:40 AM    LOS: 1 day

## 2017-04-25 NOTE — Progress Notes (Signed)
Discharge orders received.  Discharge instructions and follow-up appointments reviewed with the patient.  VSS upon discharge.  IV removed and education complete.  All belongings sent with the patient.  Transported out via wheelchair. Dee Maday M, RN   

## 2017-04-26 LAB — HEMOGLOBIN A1C
HEMOGLOBIN A1C: 5.1 % (ref 4.8–5.6)
Mean Plasma Glucose: 100 mg/dL

## 2017-04-29 LAB — CULTURE, BLOOD (ROUTINE X 2)
Culture: NO GROWTH
Culture: NO GROWTH
SPECIAL REQUESTS: ADEQUATE
Special Requests: ADEQUATE

## 2017-04-30 LAB — HIV ANTIBODY (ROUTINE TESTING W REFLEX): HIV Screen 4th Generation wRfx: NONREACTIVE

## 2017-06-05 ENCOUNTER — Emergency Department (HOSPITAL_BASED_OUTPATIENT_CLINIC_OR_DEPARTMENT_OTHER)
Admission: EM | Admit: 2017-06-05 | Discharge: 2017-06-06 | Disposition: A | Discharge status: Home / Self Care | Payer: Medicaid Other | Attending: Emergency Medicine | Admitting: Emergency Medicine

## 2017-06-05 ENCOUNTER — Encounter (HOSPITAL_BASED_OUTPATIENT_CLINIC_OR_DEPARTMENT_OTHER): Payer: Self-pay

## 2017-06-05 DIAGNOSIS — Z79899 Other long term (current) drug therapy: Secondary | ICD-10-CM | POA: Diagnosis not present

## 2017-06-05 DIAGNOSIS — G500 Trigeminal neuralgia: Secondary | ICD-10-CM | POA: Diagnosis not present

## 2017-06-05 DIAGNOSIS — I10 Essential (primary) hypertension: Secondary | ICD-10-CM | POA: Insufficient documentation

## 2017-06-05 DIAGNOSIS — F17210 Nicotine dependence, cigarettes, uncomplicated: Secondary | ICD-10-CM | POA: Insufficient documentation

## 2017-06-05 DIAGNOSIS — J45909 Unspecified asthma, uncomplicated: Secondary | ICD-10-CM | POA: Diagnosis not present

## 2017-06-05 DIAGNOSIS — R51 Headache: Secondary | ICD-10-CM | POA: Diagnosis present

## 2017-06-05 DIAGNOSIS — R519 Headache, unspecified: Secondary | ICD-10-CM

## 2017-06-05 HISTORY — DX: Bell's palsy: G51.0

## 2017-06-05 LAB — CBC
HCT: 40.6 % (ref 36.0–46.0)
HEMOGLOBIN: 14.4 g/dL (ref 12.0–15.0)
MCH: 32.2 pg (ref 26.0–34.0)
MCHC: 35.5 g/dL (ref 30.0–36.0)
MCV: 90.8 fL (ref 78.0–100.0)
PLATELETS: 294 10*3/uL (ref 150–400)
RBC: 4.47 MIL/uL (ref 3.87–5.11)
RDW: 11.9 % (ref 11.5–15.5)
WBC: 6.5 10*3/uL (ref 4.0–10.5)

## 2017-06-05 LAB — BASIC METABOLIC PANEL
Anion gap: 7 (ref 5–15)
BUN: 11 mg/dL (ref 6–20)
CHLORIDE: 107 mmol/L (ref 101–111)
CO2: 24 mmol/L (ref 22–32)
CREATININE: 0.74 mg/dL (ref 0.44–1.00)
Calcium: 9.2 mg/dL (ref 8.9–10.3)
Glucose, Bld: 111 mg/dL — ABNORMAL HIGH (ref 65–99)
POTASSIUM: 3.5 mmol/L (ref 3.5–5.1)
SODIUM: 138 mmol/L (ref 135–145)

## 2017-06-05 MED ORDER — DEXAMETHASONE SODIUM PHOSPHATE 10 MG/ML IJ SOLN
10.0000 mg | Freq: Once | INTRAMUSCULAR | Status: AC
  Administered 2017-06-05: 10 mg via INTRAVENOUS
  Filled 2017-06-05: qty 1

## 2017-06-05 MED ORDER — PROCHLORPERAZINE EDISYLATE 5 MG/ML IJ SOLN
10.0000 mg | Freq: Once | INTRAMUSCULAR | Status: AC
  Administered 2017-06-05: 10 mg via INTRAVENOUS
  Filled 2017-06-05: qty 2

## 2017-06-05 MED ORDER — DIPHENHYDRAMINE HCL 50 MG/ML IJ SOLN
25.0000 mg | Freq: Once | INTRAMUSCULAR | Status: AC
  Administered 2017-06-05: 25 mg via INTRAVENOUS
  Filled 2017-06-05: qty 1

## 2017-06-05 MED ORDER — KETOROLAC TROMETHAMINE 30 MG/ML IJ SOLN
30.0000 mg | Freq: Once | INTRAMUSCULAR | Status: AC
  Administered 2017-06-05: 30 mg via INTRAVENOUS
  Filled 2017-06-05: qty 1

## 2017-06-05 NOTE — ED Triage Notes (Signed)
C/o HA and "nerve pain" to left side of face-dx with Bells palsy approx 1 month ago-states PCP called in gabapentin today with no relief-NAD-steady gait

## 2017-06-05 NOTE — ED Provider Notes (Signed)
[facility] Provider Note   CSN: [id number] Arrival date & time: 06/05/17  2237  By signing my name below, I, Rosario AdieWilliam Andrew Hiatt, attest that this documentation has been prepared under the direction and in the presence of Wilton Thrall, Canary Brimhristopher J, MD. Electronically Signed: Rosario AdieWilliam Andrew Hiatt, ED Scribe. 06/05/17. 11:08 PM.  History   Chief Complaint Chief Complaint  Patient presents with  . Headache   The history is provided by the patient and medical records. No language interpreter was used.    HPI Comments: Deanna Owens is a 31 y.o. female with a PMHx of Bell's Palsy, HTN, migraines, and polysubstance abuse, who presents to the Emergency Department complaining of intermittent left-sided headache beginning approximately one month ago, constant and waxing and waning since yesterday. She notes that her pain radiates into her left ear. Pt reports that she was diagnosed with Bell's Palsy approximately one month ago; since then she has had a recurrent headache which is typical present and worse at night. Pt reports that her facial droop resolved following treatments, but she has had these intermittent headaches since. She describes her pain as aching and shooting across the left side of her face. At initial onset, she states that she had a CT Head and MRI performed which were both unremarkable. Pt attempted to contact her PCP today for this issue who prescribed her Gabapentin which she filled and took today without relief of her symptoms. She reports that her pain is worse with palpation over the left side of her face. Her PCP has given her referral for Neurology and she reports that she will schedule an appointment with them in the near future. Of note, pt reports that her blood pressure has been elevated today with her headache; however, her baseline is moderately high as well when she is asymptomatic. She denies chest pain, shortness of breath, visual disturbance, weakness,  numbness, syncope, or any other associated symptoms.   Past Medical History:  Diagnosis Date  . Asthma   . Bell's palsy   . Hypertension    Patient Active Problem List   Diagnosis Date Noted  . Polysubstance abuse 04/25/2017  . Hypertensive urgency 04/25/2017  . Hypokalemia 04/25/2017  . Left-sided weakness 04/24/2017  . Left sided numbness 04/24/2017   Past Surgical History:  Procedure Laterality Date  . HERNIA REPAIR     OB History    No data available     Home Medications    Prior to Admission medications   Medication Sig Start Date End Date Taking? Authorizing Provider  GABAPENTIN PO Take by mouth.   Yes [provider]  HYDRALAZINE-HCTZ PO Take by mouth.   Yes [provider]  albuterol (PROVENTIL) 4 MG tablet Take 4 mg by mouth 3 (three) times daily.    [provider]  amLODipine (NORVASC) 5 MG tablet Take 1 tablet (5 mg total) by mouth daily. 06/06/17   Bari Leib, Canary Brimhristopher J, MD  Fluticasone Propionate, Inhal, (FLOVENT IN) Inhale 1 puff into the lungs 2 (two) times daily.     [provider]  lisinopril (PRINIVIL,ZESTRIL) 20 MG tablet Take 1 tablet (20 mg total) by mouth daily. 04/26/17 05/26/17  Johnson, Clanford L, MD  oxyCODONE-acetaminophen (PERCOCET) 5-325 MG tablet Take 1 tablet by mouth every 4 (four) hours as needed. 06/06/17   Gilda CreasePollina, Bradden Tadros J, MD    Family History No family history on file.  Social History Social History  Substance Use Topics  . Smoking status: Current Every Day Smoker  Packs/day: 0.50    Types: Cigarettes  . Smokeless tobacco: Never Used  . Alcohol use Yes     Comment: daily   Allergies   Tramadol and Propranolol  Review of Systems Review of Systems  HENT: Positive for ear pain (2/t radiation).   Eyes: Negative for visual disturbance.  Respiratory: Negative for shortness of breath.   Cardiovascular: Negative for chest pain.  Neurological: Positive for headaches. Negative for  syncope, weakness and numbness.  All other systems reviewed and are negative.  Physical Exam Updated Vital Signs BP (!) 198/117 (BP Location: Left Arm)   Pulse 74   Temp 98.6 F (37 C) (Oral)   Resp 18   Ht 5\' 4"  (1.626 m)   Wt 67 kg (147 lb 11.3 oz)   SpO2 98%   BMI 25.35 kg/m   Physical Exam  Constitutional: She is oriented to person, place, and time. She appears well-developed and well-nourished. No distress.  HENT:  Head: Normocephalic and atraumatic.  Right Ear: Hearing normal.  Left Ear: Hearing normal.  Nose: Nose normal.  Mouth/Throat: Oropharynx is clear and moist and mucous membranes are normal.  Severe tenderness over the left sided of the temple and forehead without any rash or skin changes.   Eyes: Conjunctivae and EOM are normal. Pupils are equal, round, and reactive to light.  Neck: Normal range of motion. Neck supple.  Cardiovascular: Regular rhythm, S1 normal and S2 normal.  Exam reveals no gallop and no friction rub.   No murmur heard. Pulmonary/Chest: Effort normal and breath sounds normal. No respiratory distress. She exhibits no tenderness.  Abdominal: Soft. Normal appearance and bowel sounds are normal. There is no hepatosplenomegaly. There is no tenderness. There is no rebound, no guarding, no tenderness at McBurney's point and negative Murphy's sign. No hernia.  Musculoskeletal: Normal range of motion.  Neurological: She is alert and oriented to person, place, and time. She has normal strength. No cranial nerve deficit or sensory deficit. Coordination normal. GCS eye subscore is 4. GCS verbal subscore is 5. GCS motor subscore is 6.  Skin: Skin is warm, dry and intact. No rash noted. No cyanosis.  Psychiatric: She has a normal mood and affect. Her speech is normal and behavior is normal. Thought content normal.  Nursing note and vitals reviewed.  ED Treatments / Results  DIAGNOSTIC STUDIES: Oxygen Saturation is 98% on RA, normal by my interpretation.    COORDINATION OF CARE: 11:08 PM-Discussed next steps with pt. Pt verbalized understanding and is agreeable with the plan.   Labs (all labs ordered are listed, but only abnormal results are displayed) Labs Reviewed  BASIC METABOLIC PANEL - Abnormal; Notable for the following:       Result Value   Glucose, Bld 111 (*)    All other components within normal limits  CBC    EKG  EKG Interpretation None      Radiology No results found.  Procedures Procedures   Medications Ordered in ED Medications  dexamethasone (DECADRON) injection 10 mg (10 mg Intravenous Given 06/05/17 2325)  ketorolac (TORADOL) 30 MG/ML injection 30 mg (30 mg Intravenous Given 06/05/17 2325)  prochlorperazine (COMPAZINE) injection 10 mg (10 mg Intravenous Given 06/05/17 2325)  diphenhydrAMINE (BENADRYL) injection 25 mg (25 mg Intravenous Given 06/05/17 2325)    Initial Impression / Assessment and Plan / ED Course  I have reviewed the triage vital signs and the nursing notes.  Pertinent labs & imaging results that were available during my care of the  patient were reviewed by me and considered in my medical decision making (see chart for details).     Patient presents to the emergency room for evaluation of headache and elevated blood pressure. Patient has been experiencing headaches for one month. She was initially diagnosed with Bell's palsy because of facial pain and facial droop. She was treated with Valtrex and prednisone. Drooping of the face has completely resolved but she is still experiencing pain. Her pain sounds more like a trigeminal neuralgia. It starts in the temporal area and shoots across her forehead and maxilla. It is a lancinating type pain. Her doctor started her on gabapentin today. Since she already has this, will continue, consider switch to Tegretol if no improvement in a week or so. She is being scheduled for follow-up with neurology which is appropriate.  Patient had significant  improvement of her headache with migraine cocktail. Picture is mixed, likely experiencing some migraine with some element of chronic pain of the face secondary to neuralgia. She did not have any obvious neurologic deficit on examination, is appropriate for discharge. She has already had head CT and MRI which were negative.  Patient has a history of chronic hypertension. Her lisinopril has been increased and she has had hydrochlorothiazide added. Some of the hypertension worsening is likely secondary to the pain, but with improvement here her blood pressure only decreased to 195/95. Will add Norvasc to her current regimen, have follow-up in the office. Lab work was unremarkable including normal renal function.  Final Clinical Impressions(s) / ED Diagnoses   Final diagnoses:  Bad headache  Trigeminal neuralgia of left side of face   New Prescriptions New Prescriptions   AMLODIPINE (NORVASC) 5 MG TABLET    Take 1 tablet (5 mg total) by mouth daily.   OXYCODONE-ACETAMINOPHEN (PERCOCET) 5-325 MG TABLET    Take 1 tablet by mouth every 4 (four) hours as needed.   I personally performed the services described in this documentation, which was scribed in my presence. The recorded information has been reviewed and is accurate.     Gilda Crease, MD 06/06/17 [phone]

## 2017-06-06 MED ORDER — AMLODIPINE BESYLATE 5 MG PO TABS: 5.0000 mg | ORAL_TABLET | Freq: Every day | ORAL | 0 refills | Status: DC

## 2017-06-06 MED ORDER — OXYCODONE-ACETAMINOPHEN 5-325 MG PO TABS: 1.0000 | ORAL_TABLET | ORAL | 0 refills | Status: DC | PRN

## 2017-07-08 ENCOUNTER — Encounter (HOSPITAL_BASED_OUTPATIENT_CLINIC_OR_DEPARTMENT_OTHER): Payer: Self-pay

## 2017-07-08 ENCOUNTER — Emergency Department (HOSPITAL_BASED_OUTPATIENT_CLINIC_OR_DEPARTMENT_OTHER)
Admission: EM | Admit: 2017-07-08 | Discharge: 2017-07-08 | Disposition: A | Discharge status: Home / Self Care | Payer: Medicaid Other | Attending: Emergency Medicine | Admitting: Emergency Medicine

## 2017-07-08 DIAGNOSIS — I10 Essential (primary) hypertension: Secondary | ICD-10-CM | POA: Diagnosis not present

## 2017-07-08 DIAGNOSIS — L02412 Cutaneous abscess of left axilla: Secondary | ICD-10-CM

## 2017-07-08 DIAGNOSIS — J45909 Unspecified asthma, uncomplicated: Secondary | ICD-10-CM | POA: Insufficient documentation

## 2017-07-08 DIAGNOSIS — F17210 Nicotine dependence, cigarettes, uncomplicated: Secondary | ICD-10-CM | POA: Diagnosis not present

## 2017-07-08 MED ORDER — CEPHALEXIN 500 MG PO CAPS: 500.0000 mg | ORAL_CAPSULE | Freq: Four times a day (QID) | ORAL | 0 refills | Status: DC

## 2017-07-08 MED ORDER — IBUPROFEN 600 MG PO TABS: 600.0000 mg | ORAL_TABLET | Freq: Four times a day (QID) | ORAL | 0 refills | Status: DC | PRN

## 2017-07-08 MED ORDER — SULFAMETHOXAZOLE-TRIMETHOPRIM 800-160 MG PO TABS: 1.0000 | ORAL_TABLET | Freq: Two times a day (BID) | ORAL | 0 refills | Status: AC

## 2017-07-08 NOTE — ED Triage Notes (Signed)
C/o left axilla abscess x 3 days-NAD-steady gait

## 2017-07-08 NOTE — ED Provider Notes (Signed)
[facility] Provider Note   CSN: [id number] Arrival date & time: 07/08/17  1854  By signing my name below, I, Deanna Owens, attest that this documentation has been prepared under the direction and in the presence of Nicole Hafley, PA-C. Electronically Signed: Linna Darnerussell Owens, Scribe. 07/08/2017. 7:35 PM.  History   Chief Complaint Chief Complaint  Patient presents with  . Abscess   The history is provided by the patient. No language interpreter was used.    HPI Comments: Deanna Owens is a 31 y.o. female who presents to the Emergency Department complaining of a moderate, gradually worsening area of pain, redness and swelling to the left axilla beginning two days ago. The pain is worse with palpation. She has applied warm compresses without improvement. Patient had an abscess to the left axilla three years ago that required incision and drainage. She denies fevers, chills, neuro deficits and extremity, drainage from the area, or any other associated symptoms.  Past Medical History:  Diagnosis Date  . Asthma   . Bell's palsy   . Hypertension     Patient Active Problem List   Diagnosis Date Noted  . Polysubstance abuse 04/25/2017  . Hypertensive urgency 04/25/2017  . Hypokalemia 04/25/2017  . Left-sided weakness 04/24/2017  . Left sided numbness 04/24/2017    Past Surgical History:  Procedure Laterality Date  . HERNIA REPAIR      OB History    No data available       Home Medications    Prior to Admission medications   Medication Sig Start Date End Date Taking? Authorizing Provider  ALBUTEROL IN Inhale into the lungs.   Yes [provider]  amLODipine (NORVASC) 5 MG tablet Take 1 tablet (5 mg total) by mouth daily. 06/06/17   Gilda CreasePollina, Christopher J, MD  cephALEXin (KEFLEX) 500 MG capsule Take 1 capsule (500 mg total) by mouth 4 (four) times daily. 07/08/17   Ladd Cen C, PA-C  Fluticasone Propionate, Inhal, (FLOVENT IN) Inhale 1 puff into the lungs  2 (two) times daily.     [provider]  GABAPENTIN PO Take by mouth.    [provider]  HYDRALAZINE-HCTZ PO Take by mouth.    [provider]  ibuprofen (ADVIL,MOTRIN) 600 MG tablet Take 1 tablet (600 mg total) by mouth every 6 (six) hours as needed. 07/08/17   Ossie Beltran C, PA-C  lisinopril (PRINIVIL,ZESTRIL) 20 MG tablet Take 1 tablet (20 mg total) by mouth daily. 04/26/17 05/26/17  Johnson, Clanford L, MD  sulfamethoxazole-trimethoprim (BACTRIM DS,SEPTRA DS) 800-160 MG tablet Take 1 tablet by mouth 2 (two) times daily. 07/08/17 07/15/17  Anselm PancoastJoy, Hoa Deriso C, PA-C    Family History No family history on file.  Social History Social History  Substance Use Topics  . Smoking status: Current Every Day Smoker    Packs/day: 0.50    Types: Cigarettes  . Smokeless tobacco: Never Used  . Alcohol use Yes     Comment: daily     Allergies   Tramadol and Propranolol   Review of Systems Review of Systems  Constitutional: Negative for chills and fever.  Skin: Positive for color change and wound.  Neurological: Negative for weakness and numbness.   Physical Exam Updated Vital Signs BP (!) 151/98 (BP Location: Left Arm)   Pulse 98   Temp 98.6 F (37 C) (Oral)   Resp 18   Ht 5\' 4"  (1.626 m)   Wt 147 lb (66.7 kg)   SpO2 100%   BMI  25.23 kg/m   Physical Exam  Constitutional: She appears well-developed and well-nourished. No distress.  HENT:  Head: Normocephalic and atraumatic.  Eyes: Conjunctivae are normal.  Neck: Neck supple.  Cardiovascular: Normal rate, regular rhythm and intact distal pulses.   Pulmonary/Chest: Effort normal.  Musculoskeletal: She exhibits tenderness.  Full range of motion in left shoulder, though painful.  Neurological: She is alert.  No sensory deficits noted in the upper extremities. Strength 5/5 bilaterally in the upper extremities.  Skin: Skin is warm and dry. Capillary refill takes less than 2 seconds. She is not diaphoretic. There  is erythema.  Area of swelling, erythema, and tenderness to the left superior axilla measuring about 5 cm x 3 cm. Area of fluctuance measures approximately 3 x 3 cm. No active drainage noted.  Psychiatric: She has a normal mood and affect. Her behavior is normal.  Nursing note and vitals reviewed.  ED Treatments / Results  Labs (all labs ordered are listed, but only abnormal results are displayed) Labs Reviewed - No data to display  EKG  EKG Interpretation None       Radiology No results found.  Procedures .Marland Kitchen.Incision and Drainage Date/Time: 07/08/2017 7:40 PM Performed by: Anselm PancoastJOY, Seraiah Nowack C Authorized by: Harolyn RutherfordJOY, Shaylee Stanislawski C   Consent:    Consent obtained:  Verbal   Consent given by:  Patient   Risks discussed:  Bleeding, incomplete drainage, pain and infection Location:    Type:  Abscess   Size:  3x3cm   Location:  Trunk   Trunk location:  Chest (Left axilla) Pre-procedure details:    Skin preparation:  Betadine Anesthesia (see MAR for exact dosages):    Anesthesia method:  Local infiltration   Local anesthetic:  Bupivacaine 0.5% w/o epi and lidocaine 1% WITH epi Procedure type:    Complexity:  Complex Procedure details:    Incision types:  Cruciate   Incision depth:  Subcutaneous   Scalpel blade:  11   Wound management:  Probed and deloculated, irrigated with saline and extensive cleaning   Drainage:  Purulent and bloody   Drainage amount:  Copious   Wound treatment:  Drain placed   Packing materials:  1/2 in iodoform gauze Post-procedure details:    Patient tolerance of procedure:  Tolerated well, no immediate complications    (including critical care time)  EMERGENCY DEPARTMENT US SOFT TISSUE INTERPRETATION "Study: Limited Soft Tissue Ultrasound"  INDICATIONS: Pain and Soft tissue infection Multiple views of the body part were obtained in real-time with a multi-frequency linear probe  PERFORMED BY: Myself IMAGES ARCHIVED?: Yes SIDE:Left BODY  PART:Axilla INTERPRETATION:  Abcess present and Cellulitis present    DIAGNOSTIC STUDIES: Oxygen Saturation is 100% on RA, normal by my interpretation.    COORDINATION OF CARE: 7:32 PM Discussed treatment plan with pt at bedside and pt agreed to plan.  Medications Ordered in ED Medications - No data to display   Initial Impression / Assessment and Plan / ED Course  I have reviewed the triage vital signs and the nursing notes.  Pertinent labs & imaging results that were available during my care of the patient were reviewed by me and considered in my medical decision making (see chart for details).     Deanna Owens is a 31 y/o female presenting to the ED with skin abscess. Incision and drainage performed in the ED today Without immediate complication. Wound check in 3 days. PCP follow up for further management. The patient was given instructions for home care as well  as return precautions. Patient voices understanding of these instructions, accepts the plan, and is comfortable with discharge.   Final Clinical Impressions(s) / ED Diagnoses   Final diagnoses:  Abscess of left axilla    New Prescriptions Discharge Medication List as of 07/08/2017  8:19 PM    START taking these medications   Details  cephALEXin (KEFLEX) 500 MG capsule Take 1 capsule (500 mg total) by mouth 4 (four) times daily., Starting Wed 07/08/2017, Print    ibuprofen (ADVIL,MOTRIN) 600 MG tablet Take 1 tablet (600 mg total) by mouth every 6 (six) hours as needed., Starting Wed 07/08/2017, Print    sulfamethoxazole-trimethoprim (BACTRIM DS,SEPTRA DS) 800-160 MG tablet Take 1 tablet by mouth 2 (two) times daily., Starting Wed 07/08/2017, Until Wed 07/15/2017, Print       I personally performed the services described in this documentation, which was scribed in my presence. The recorded information has been reviewed and is accurate.   Anselm Pancoast, PA-C 07/09/17 1539    Tilden Fossa, MD 07/10/17 1515

## 2017-07-08 NOTE — ED Notes (Signed)
ED Provider at bedside. 

## 2017-07-08 NOTE — Discharge Instructions (Signed)
You may remove the bandage after 24 hours if the drainage has stopped. The packing may come out in 24-48 hours, if not it will be removed at your wound check. Clean the wound and surrounding area gently with tap water and mild soap. Rinse well and blot dry.  Clean the wound daily to prevent infection. Do not use cleaners such as hydrogen peroxide or alcohol. Soaking the wound in an Epsom salt bath can help wound healing.  Scar reduction: Application of a topical antibiotic ointment, such as Neosporin, after the wound has begun to close and heal well can decrease scab formation and reduce scarring. After the wound has healed, application of ointments such as Aquaphor can also reduce scar formation.  The key to scar reduction is keeping the skin well hydrated and supple. Drinking plenty of water throughout the day (At least eight 8oz glasses of water a day) is essential to staying well hydrated.  Sun exposure: Keep the wound out of the sun. After the wound has healed, continue to protect it from the sun by wearing protective clothing or applying sunscreen.  Pain: You may use Tylenol, naproxen, or ibuprofen for pain.  Return to the ED or follow-up with primary care provider in 3 days for a wound check. Your primary care provider should manage this issue for any visits beyond the wound check.  Return to the ED sooner should signs of worsening infection arise, such as spreading redness, puffiness/swelling, pus draining from the wound, severe increase in pain, fever over 100.79F, or any other major issues.

## 2017-09-03 ENCOUNTER — Encounter: Payer: Self-pay | Admitting: *Deleted

## 2017-09-04 ENCOUNTER — Ambulatory Visit: Payer: Self-pay | Admitting: Neurology

## 2017-09-08 ENCOUNTER — Encounter: Payer: Self-pay | Admitting: Neurology

## 2018-02-09 ENCOUNTER — Other Ambulatory Visit: Payer: Self-pay

## 2018-02-09 ENCOUNTER — Emergency Department (HOSPITAL_BASED_OUTPATIENT_CLINIC_OR_DEPARTMENT_OTHER)
Admission: EM | Admit: 2018-02-09 | Discharge: 2018-02-09 | Disposition: A | Discharge status: Home / Self Care | Payer: Medicaid Other | Attending: Emergency Medicine | Admitting: Emergency Medicine

## 2018-02-09 ENCOUNTER — Encounter (HOSPITAL_BASED_OUTPATIENT_CLINIC_OR_DEPARTMENT_OTHER): Payer: Self-pay | Admitting: Emergency Medicine

## 2018-02-09 DIAGNOSIS — J45909 Unspecified asthma, uncomplicated: Secondary | ICD-10-CM | POA: Insufficient documentation

## 2018-02-09 DIAGNOSIS — Z79899 Other long term (current) drug therapy: Secondary | ICD-10-CM | POA: Insufficient documentation

## 2018-02-09 DIAGNOSIS — F17210 Nicotine dependence, cigarettes, uncomplicated: Secondary | ICD-10-CM | POA: Insufficient documentation

## 2018-02-09 DIAGNOSIS — I10 Essential (primary) hypertension: Secondary | ICD-10-CM | POA: Insufficient documentation

## 2018-02-09 DIAGNOSIS — T783XXA Angioneurotic edema, initial encounter: Secondary | ICD-10-CM | POA: Diagnosis present

## 2018-02-09 MED ORDER — DEXAMETHASONE 4 MG PO TABS
10.0000 mg | ORAL_TABLET | Freq: Once | ORAL | Status: AC
  Administered 2018-02-09: 10 mg via ORAL
  Filled 2018-02-09: qty 1

## 2018-02-09 MED ORDER — FAMOTIDINE 20 MG PO TABS
40.0000 mg | ORAL_TABLET | Freq: Once | ORAL | Status: AC
  Administered 2018-02-09: 40 mg via ORAL
  Filled 2018-02-09: qty 2

## 2018-02-09 NOTE — ED Triage Notes (Signed)
Patient states that she had had swelling to her upper lip since about 8 am  - patient denies any SOB or trouble swallowing

## 2018-02-09 NOTE — ED Provider Notes (Signed)
[facility] EMERGENCY DEPARTMENT Provider Note   CSN: [id number] Arrival date & time: 02/09/18  1416     History   Chief Complaint Chief Complaint  Patient presents with  . Oral Swelling    HPI Deanna Owens is a 32 y.o. female.  32 year old female with past medical history including hypertension, migraines, asthma, Bell's palsy who presents with lip swelling.  Around 8 AM this morning, she noticed swelling of her upper lip.  It was initially on the left side it is now more right-sided.  It has slightly improved since it began.  She denies any associated rash, breathing problems, swallowing problems, vomiting, or other complaints.  She notes that she has been on lisinopril since last year, but was off of it for a little while and restarted it a few weeks ago.  She thinks her mom may have had a problem with swelling and was taken off of lisinopril in the past.  She denies any new soaps or bath products, or any new foods.   The history is provided by the patient.    Past Medical History:  Diagnosis Date  . Asthma   . Bell's palsy   . Hypertension   . Migraine     Patient Active Problem List   Diagnosis Date Noted  . Polysubstance abuse (HCC) 04/25/2017  . Hypertensive urgency 04/25/2017  . Hypokalemia 04/25/2017  . Left-sided weakness 04/24/2017  . Left sided numbness 04/24/2017    Past Surgical History:  Procedure Laterality Date  . HERNIA REPAIR      OB History    No data available       Home Medications    Prior to Admission medications   Medication Sig Start Date End Date Taking? Authorizing Provider  ALBUTEROL IN Inhale into the lungs.    [provider]  amLODipine (NORVASC) 5 MG tablet Take 1 tablet (5 mg total) by mouth daily. 06/06/17   Gilda Crease, MD  cephALEXin (KEFLEX) 500 MG capsule Take 1 capsule (500 mg total) by mouth 4 (four) times daily. 07/08/17   Joy, Shawn C, PA-C  Fluticasone Propionate, Inhal, (FLOVENT IN)  Inhale 1 puff into the lungs 2 (two) times daily.     [provider]  GABAPENTIN PO Take by mouth.    [provider]  HYDRALAZINE-HCTZ PO Take by mouth.    [provider]  ibuprofen (ADVIL,MOTRIN) 600 MG tablet Take 1 tablet (600 mg total) by mouth every 6 (six) hours as needed. 07/08/17   Anselm Pancoast, PA-C    Family History History reviewed. No pertinent family history.  Social History Social History   Tobacco Use  . Smoking status: Current Every Day Smoker    Packs/day: 0.50    Types: Cigarettes  . Smokeless tobacco: Never Used  Substance Use Topics  . Alcohol use: Yes    Comment: daily  . Drug use: Yes    Types: Marijuana     Allergies   Tramadol and Propranolol   Review of Systems Review of Systems All other systems reviewed and are negative except that which was mentioned in HPI   Physical Exam Updated Vital Signs BP 126/87   Pulse 89   Temp 97.9 F (36.6 C) (Oral)   Resp 18   Ht 5\' 4"  (1.626 m)   Wt 65.8 kg (145 lb)   SpO2 99%   BMI 24.89 kg/m   Physical Exam  Constitutional: She is oriented to person, place, and time. She  appears well-developed and well-nourished. No distress.  HENT:  Head: Normocephalic and atraumatic.  Mouth/Throat: Oropharynx is clear and moist.  Moist mucous membranes Mild upper lip swelling R>L side, no tongue involvement  Eyes: Conjunctivae are normal.  Neck: Neck supple.  Cardiovascular: Normal rate, regular rhythm and normal heart sounds.  No murmur heard. Pulmonary/Chest: Effort normal and breath sounds normal.  Abdominal: Soft. Bowel sounds are normal. She exhibits no distension. There is no tenderness.  Musculoskeletal: She exhibits no edema.  Neurological: She is alert and oriented to person, place, and time.  Fluent speech  Skin: Skin is warm and dry. No rash noted.  Psychiatric: She has a normal mood and affect. Judgment normal.  Nursing note and vitals reviewed.    ED Treatments /  Results  Labs (all labs ordered are listed, but only abnormal results are displayed) Labs Reviewed - No data to display  EKG  EKG Interpretation None       Radiology No results found.  Procedures Procedures (including critical care time)  Medications Ordered in ED Medications  dexamethasone (DECADRON) tablet 10 mg (10 mg Oral Given 02/09/18 1544)  famotidine (PEPCID) tablet 40 mg (40 mg Oral Given 02/09/18 1544)     Initial Impression / Assessment and Plan / ED Course  I have reviewed the triage vital signs and the nursing notes.       Well appearing, comfortable, and in NAD on exam w/ normal VS. Mild lip swelling without any other signs/sx of anaphylaxis. Given lisinopril use, I suspect ACE inhibitor angioedema and have instructed patient to discontinue lisinopril and follow-up with PCP for further instructions on antihypertensive medication.  Because differential includes allergic reaction, gave the patient a dose of Decadron and instructed on taking Benadryl and Pepcid for the next 2 days.  Extensively reviewed return precautions. Final Clinical Impressions(s) / ED Diagnoses   Final diagnoses:  Angioedema of lips, initial encounter    ED Discharge Orders    None       Little, Ambrose Finlandachel Morgan, MD 02/09/18 [phone]

## 2018-02-09 NOTE — Discharge Instructions (Signed)
Stop taking lisinopril and continue your other medicines. Follow up with your primary care provider to discuss what medicine to use instead of lisinopril. Take benadryl when you get home. You may continue benadryl and pepcid today and tomorrow. RETURN TO ER IF ANY BREATHING PROBLEMS, HIVES, OR WORSENING SWELLING.

## 2018-03-05 ENCOUNTER — Telehealth: Payer: Self-pay | Admitting: Neurology

## 2018-03-05 ENCOUNTER — Encounter: Payer: Self-pay | Admitting: Neurology

## 2018-03-05 ENCOUNTER — Ambulatory Visit: Payer: Medicaid Other | Admitting: Neurology

## 2018-03-05 NOTE — Telephone Encounter (Signed)
This patient canceled same day of appointment.  This is a second no-show as a new patient.  The patient will be discharged from our practice.

## 2018-03-09 ENCOUNTER — Encounter: Payer: Self-pay | Admitting: Neurology

## 2018-04-16 ENCOUNTER — Ambulatory Visit: Payer: Medicaid Other | Admitting: Neurology

## 2018-08-11 ENCOUNTER — Encounter (HOSPITAL_BASED_OUTPATIENT_CLINIC_OR_DEPARTMENT_OTHER): Payer: Self-pay | Admitting: Emergency Medicine

## 2018-08-11 ENCOUNTER — Emergency Department (HOSPITAL_BASED_OUTPATIENT_CLINIC_OR_DEPARTMENT_OTHER): Payer: Medicaid Other

## 2018-08-11 ENCOUNTER — Inpatient Hospital Stay (HOSPITAL_BASED_OUTPATIENT_CLINIC_OR_DEPARTMENT_OTHER)
Admission: EM | Admit: 2018-08-11 | Discharge: 2018-08-12 | DRG: 203 | Disposition: A | Discharge status: Home / Self Care | Payer: Medicaid Other | Attending: Family Medicine | Admitting: Family Medicine

## 2018-08-11 ENCOUNTER — Other Ambulatory Visit: Payer: Self-pay

## 2018-08-11 DIAGNOSIS — Z888 Allergy status to other drugs, medicaments and biological substances status: Secondary | ICD-10-CM | POA: Diagnosis not present

## 2018-08-11 DIAGNOSIS — F17200 Nicotine dependence, unspecified, uncomplicated: Secondary | ICD-10-CM | POA: Diagnosis present

## 2018-08-11 DIAGNOSIS — I10 Essential (primary) hypertension: Secondary | ICD-10-CM | POA: Diagnosis present

## 2018-08-11 DIAGNOSIS — J45901 Unspecified asthma with (acute) exacerbation: Secondary | ICD-10-CM

## 2018-08-11 DIAGNOSIS — E876 Hypokalemia: Secondary | ICD-10-CM

## 2018-08-11 DIAGNOSIS — Z7951 Long term (current) use of inhaled steroids: Secondary | ICD-10-CM

## 2018-08-11 DIAGNOSIS — Z9114 Patient's other noncompliance with medication regimen: Secondary | ICD-10-CM | POA: Diagnosis not present

## 2018-08-11 DIAGNOSIS — Z79899 Other long term (current) drug therapy: Secondary | ICD-10-CM

## 2018-08-11 DIAGNOSIS — F1910 Other psychoactive substance abuse, uncomplicated: Secondary | ICD-10-CM | POA: Diagnosis present

## 2018-08-11 DIAGNOSIS — I160 Hypertensive urgency: Secondary | ICD-10-CM | POA: Diagnosis present

## 2018-08-11 DIAGNOSIS — Z885 Allergy status to narcotic agent status: Secondary | ICD-10-CM

## 2018-08-11 DIAGNOSIS — J4541 Moderate persistent asthma with (acute) exacerbation: Secondary | ICD-10-CM | POA: Diagnosis present

## 2018-08-11 DIAGNOSIS — J4540 Moderate persistent asthma, uncomplicated: Secondary | ICD-10-CM | POA: Diagnosis present

## 2018-08-11 DIAGNOSIS — J4521 Mild intermittent asthma with (acute) exacerbation: Secondary | ICD-10-CM | POA: Diagnosis not present

## 2018-08-11 LAB — BASIC METABOLIC PANEL
ANION GAP: 10 (ref 5–15)
BUN: 12 mg/dL (ref 6–20)
CALCIUM: 8.5 mg/dL — AB (ref 8.9–10.3)
CO2: 26 mmol/L (ref 22–32)
CREATININE: 0.74 mg/dL (ref 0.44–1.00)
Chloride: 106 mmol/L (ref 98–111)
Glucose, Bld: 116 mg/dL — ABNORMAL HIGH (ref 70–99)
Potassium: 3.3 mmol/L — ABNORMAL LOW (ref 3.5–5.1)
SODIUM: 142 mmol/L (ref 135–145)

## 2018-08-11 LAB — CBC WITH DIFFERENTIAL/PLATELET
BASOS ABS: 0 10*3/uL (ref 0.0–0.1)
BASOS PCT: 0 %
EOS ABS: 0.2 10*3/uL (ref 0.0–0.7)
EOS PCT: 3 %
HCT: 39.3 % (ref 36.0–46.0)
Hemoglobin: 13.3 g/dL (ref 12.0–15.0)
Lymphocytes Relative: 21 %
Lymphs Abs: 1.6 10*3/uL (ref 0.7–4.0)
MCH: 31.3 pg (ref 26.0–34.0)
MCHC: 33.8 g/dL (ref 30.0–36.0)
MCV: 92.5 fL (ref 78.0–100.0)
MONO ABS: 0.4 10*3/uL (ref 0.1–1.0)
Monocytes Relative: 5 %
NEUTROS ABS: 5.6 10*3/uL (ref 1.7–7.7)
Neutrophils Relative %: 71 %
PLATELETS: 206 10*3/uL (ref 150–400)
RBC: 4.25 MIL/uL (ref 3.87–5.11)
RDW: 12.8 % (ref 11.5–15.5)
WBC: 7.9 10*3/uL (ref 4.0–10.5)

## 2018-08-11 LAB — TSH: TSH: 0.656 u[IU]/mL (ref 0.350–4.500)

## 2018-08-11 MED ORDER — IBUPROFEN 200 MG PO TABS
600.0000 mg | ORAL_TABLET | Freq: Four times a day (QID) | ORAL | Status: DC | PRN
  Filled 2018-08-11: qty 3

## 2018-08-11 MED ORDER — IPRATROPIUM BROMIDE 0.02 % IN SOLN
0.5000 mg | Freq: Once | RESPIRATORY_TRACT | Status: AC
  Administered 2018-08-11: 0.5 mg via RESPIRATORY_TRACT

## 2018-08-11 MED ORDER — METHYLPREDNISOLONE SODIUM SUCC 125 MG IJ SOLR
125.0000 mg | Freq: Once | INTRAMUSCULAR | Status: AC
  Administered 2018-08-11: 125 mg via INTRAVENOUS
  Filled 2018-08-11: qty 2

## 2018-08-11 MED ORDER — ONDANSETRON HCL 4 MG PO TABS: 4.0000 mg | ORAL_TABLET | Freq: Four times a day (QID) | ORAL | Status: DC | PRN

## 2018-08-11 MED ORDER — ALBUTEROL (5 MG/ML) CONTINUOUS INHALATION SOLN
15.0000 mg/h | INHALATION_SOLUTION | Freq: Once | RESPIRATORY_TRACT | Status: AC
  Administered 2018-08-11: 15 mg/h via RESPIRATORY_TRACT

## 2018-08-11 MED ORDER — KETOROLAC TROMETHAMINE 15 MG/ML IJ SOLN
15.0000 mg | Freq: Once | INTRAMUSCULAR | Status: AC
  Administered 2018-08-11: 15 mg via INTRAVENOUS
  Filled 2018-08-11: qty 1

## 2018-08-11 MED ORDER — METHYLPREDNISOLONE SODIUM SUCC 125 MG IJ SOLR
125.0000 mg | Freq: Four times a day (QID) | INTRAMUSCULAR | Status: DC
  Administered 2018-08-11 – 2018-08-12 (×3): 125 mg via INTRAVENOUS
  Filled 2018-08-11 (×3): qty 2

## 2018-08-11 MED ORDER — SODIUM CHLORIDE 0.9 % IV SOLN
INTRAVENOUS | Status: DC
  Administered 2018-08-11 – 2018-08-12 (×2): via INTRAVENOUS

## 2018-08-11 MED ORDER — HYDRALAZINE HCL 50 MG PO TABS
100.0000 mg | ORAL_TABLET | Freq: Three times a day (TID) | ORAL | Status: DC
  Administered 2018-08-11 – 2018-08-12 (×2): 100 mg via ORAL
  Filled 2018-08-11 (×2): qty 2

## 2018-08-11 MED ORDER — PREDNISONE 50 MG PO TABS
60.0000 mg | ORAL_TABLET | Freq: Once | ORAL | Status: AC
  Administered 2018-08-11: 60 mg via ORAL
  Filled 2018-08-11: qty 1

## 2018-08-11 MED ORDER — LORAZEPAM 2 MG/ML IJ SOLN
1.0000 mg | Freq: Once | INTRAMUSCULAR | Status: AC
  Administered 2018-08-11: 1 mg via INTRAVENOUS
  Filled 2018-08-11: qty 1

## 2018-08-11 MED ORDER — IPRATROPIUM-ALBUTEROL 0.5-2.5 (3) MG/3ML IN SOLN
3.0000 mL | Freq: Four times a day (QID) | RESPIRATORY_TRACT | Status: DC
  Administered 2018-08-11 – 2018-08-12 (×3): 3 mL via RESPIRATORY_TRACT
  Filled 2018-08-11 (×3): qty 3

## 2018-08-11 MED ORDER — ONDANSETRON HCL 4 MG/2ML IJ SOLN: 4.0000 mg | Freq: Four times a day (QID) | INTRAMUSCULAR | Status: DC | PRN

## 2018-08-11 MED ORDER — AMLODIPINE BESYLATE 5 MG PO TABS
5.0000 mg | ORAL_TABLET | Freq: Every day | ORAL | Status: DC
  Administered 2018-08-11 – 2018-08-12 (×2): 5 mg via ORAL
  Filled 2018-08-11 (×2): qty 1

## 2018-08-11 MED ORDER — ALBUTEROL SULFATE (2.5 MG/3ML) 0.083% IN NEBU
2.5000 mg | INHALATION_SOLUTION | RESPIRATORY_TRACT | Status: DC | PRN
  Administered 2018-08-12: 2.5 mg via RESPIRATORY_TRACT
  Filled 2018-08-11: qty 3

## 2018-08-11 MED ORDER — ACETAMINOPHEN 500 MG PO TABS
1000.0000 mg | ORAL_TABLET | Freq: Four times a day (QID) | ORAL | Status: DC | PRN
  Administered 2018-08-11 – 2018-08-12 (×2): 1000 mg via ORAL
  Filled 2018-08-11 (×2): qty 2

## 2018-08-11 MED ORDER — IPRATROPIUM BROMIDE 0.02 % IN SOLN
0.5000 mg | Freq: Once | RESPIRATORY_TRACT | Status: AC
Start: 2018-08-11 — End: 2018-08-11
  Administered 2018-08-11: 0.5 mg via RESPIRATORY_TRACT

## 2018-08-11 MED ORDER — POTASSIUM CHLORIDE CRYS ER 20 MEQ PO TBCR
40.0000 meq | EXTENDED_RELEASE_TABLET | Freq: Once | ORAL | Status: AC
  Administered 2018-08-11: 40 meq via ORAL
  Filled 2018-08-11: qty 2

## 2018-08-11 MED ORDER — ALBUTEROL (5 MG/ML) CONTINUOUS INHALATION SOLN
10.0000 mg/h | INHALATION_SOLUTION | RESPIRATORY_TRACT | Status: DC
  Administered 2018-08-11: 10 mg/h via RESPIRATORY_TRACT
  Filled 2018-08-11: qty 20

## 2018-08-11 MED ORDER — MAGNESIUM SULFATE 2 GM/50ML IV SOLN
2.0000 g | Freq: Once | INTRAVENOUS | Status: AC
  Administered 2018-08-11: 2 g via INTRAVENOUS
  Filled 2018-08-11: qty 50

## 2018-08-11 NOTE — ED Notes (Signed)
Patient given sandwich, graham crackers, peanut butter and juice

## 2018-08-11 NOTE — ED Notes (Signed)
Pt denies chest pain on assessment- pt reports feeling like she is unable to breath and her chest is tight.

## 2018-08-11 NOTE — ED Provider Notes (Addendum)
[facility] EMERGENCY DEPARTMENT Provider Note   CSN: [id number] Arrival date & time: 08/11/18  1253     History   Chief Complaint Chief Complaint  Patient presents with  . Shortness of Breath    HPI Deanna Owens is a 32 y.o. female.  32 yo F with a chief complaint of shortness of breath.  This started somewhat acutely over the past 3 hours.  She has a history of asthma and tried her inhalers without improvement.  She denies cough congestion or fever denies sick contacts.  She has had an issue with her asthma but is never had to be placed in the hospital for his or been intubated or placed in the ICU.  Started somewhat shortly after she took a naproxen for back pain.  Denies lower extremity edema denies hemoptysis.  The history is provided by the patient.  Shortness of Breath  This is a new problem. The problem occurs rarely.The current episode started less than 1 hour ago. The problem has not changed since onset.Associated symptoms include wheezing. Pertinent negatives include no fever, no headaches, no rhinorrhea, no chest pain and no vomiting. She has tried beta-agonist inhalers for the symptoms. The treatment provided mild relief. She has had prior hospitalizations. She has had prior ED visits. Associated medical issues include asthma.    Past Medical History:  Diagnosis Date  . Asthma   . Bell's palsy   . Hypertension   . Migraine     Patient Active Problem List   Diagnosis Date Noted  . Polysubstance abuse (HCC) 04/25/2017  . Hypertensive urgency 04/25/2017  . Hypokalemia 04/25/2017  . Left-sided weakness 04/24/2017  . Left sided numbness 04/24/2017    Past Surgical History:  Procedure Laterality Date  . HERNIA REPAIR       OB History   None      Home Medications    Prior to Admission medications   Medication Sig Start Date End Date Taking? Authorizing Provider  ALBUTEROL IN Inhale into the lungs.    [provider]  amLODipine  (NORVASC) 5 MG tablet Take 1 tablet (5 mg total) by mouth daily. 06/06/17   Gilda Crease, MD  cephALEXin (KEFLEX) 500 MG capsule Take 1 capsule (500 mg total) by mouth 4 (four) times daily. 07/08/17   Joy, Shawn C, PA-C  Fluticasone Propionate, Inhal, (FLOVENT IN) Inhale 1 puff into the lungs 2 (two) times daily.     [provider]  GABAPENTIN PO Take by mouth.    [provider]  HYDRALAZINE-HCTZ PO Take by mouth.    [provider]  ibuprofen (ADVIL,MOTRIN) 600 MG tablet Take 1 tablet (600 mg total) by mouth every 6 (six) hours as needed. 07/08/17   Anselm Pancoast, PA-C    Family History History reviewed. No pertinent family history.  Social History Social History   Tobacco Use  . Smoking status: Former Smoker    Packs/day: 0.50    Types: Cigarettes  . Smokeless tobacco: Never Used  Substance Use Topics  . Alcohol use: Yes    Comment: daily  . Drug use: Yes    Types: Marijuana     Allergies   Tramadol and Propranolol   Review of Systems Review of Systems  Constitutional: Negative for chills and fever.  HENT: Negative for congestion and rhinorrhea.   Eyes: Negative for redness and visual disturbance.  Respiratory: Positive for shortness of breath and wheezing.   Cardiovascular: Negative for chest pain and palpitations.  Gastrointestinal: Negative for nausea and vomiting.  Genitourinary: Negative for dysuria and urgency.  Musculoskeletal: Negative for arthralgias and myalgias.  Skin: Negative for pallor and wound.  Neurological: Negative for dizziness and headaches.     Physical Exam Updated Vital Signs BP (!) 192/116   Pulse (!) 123   Temp 98.3 F (36.8 C) (Oral)   Resp (!) 28   Ht 5\' 4"  (1.626 m)   Wt 66.2 kg   SpO2 100%   BMI 25.06 kg/m   Physical Exam  Constitutional: She is oriented to person, place, and time. She appears well-developed and well-nourished. No distress.  HENT:  Head: Normocephalic and atraumatic.    Eyes: Pupils are equal, round, and reactive to light. EOM are normal.  Neck: Normal range of motion. Neck supple.  Cardiovascular: Normal rate and regular rhythm. Exam reveals no gallop and no friction rub.  No murmur heard. Pulmonary/Chest: She is in respiratory distress. She has wheezes. She has no rales.  Abdominal: Soft. She exhibits no distension and no mass. There is no tenderness. There is no guarding.  Musculoskeletal: She exhibits no edema or tenderness.  Neurological: She is alert and oriented to person, place, and time.  Skin: Skin is warm and dry. She is not diaphoretic.  Psychiatric: She has a normal mood and affect. Her behavior is normal.  Nursing note and vitals reviewed.    ED Treatments / Results  Labs (all labs ordered are listed, but only abnormal results are displayed) Labs Reviewed  BASIC METABOLIC PANEL - Abnormal; Notable for the following components:      Result Value   Potassium 3.3 (*)    Glucose, Bld 116 (*)    Calcium 8.5 (*)    All other components within normal limits  CBC WITH DIFFERENTIAL/PLATELET    EKG None  Radiology Dg Chest Portable 1 View  Result Date: 08/11/2018 CLINICAL DATA:  32 year old female with a history of shortness of breath EXAM: PORTABLE CHEST 1 VIEW COMPARISON:  04/25/2017, 12/21/2016 FINDINGS: The heart size and mediastinal contours are within normal limits. Both lungs are clear. The visualized skeletal structures are unremarkable. IMPRESSION: Negative for acute cardiopulmonary disease Electronically Signed   By: Gilmer MorJaime  Wagner D.O.   On: 08/11/2018 13:43    Procedures Procedures (including critical care time)  Medications Ordered in ED Medications  albuterol (PROVENTIL,VENTOLIN) solution continuous neb (10 mg/hr Nebulization New Bag/Given 08/11/18 1509)  albuterol (PROVENTIL,VENTOLIN) solution continuous neb (15 mg/hr Nebulization Given 08/11/18 1308)  ipratropium (ATROVENT) nebulizer solution 0.5 mg (0.5 mg Nebulization  Given 08/11/18 1308)  ipratropium (ATROVENT) nebulizer solution 0.5 mg (0.5 mg Nebulization Given 08/11/18 1308)  magnesium sulfate IVPB 2 g 50 mL ( Intravenous Stopped 08/11/18 1402)  predniSONE (DELTASONE) tablet 60 mg (60 mg Oral Given 08/11/18 1431)     Initial Impression / Assessment and Plan / ED Course  I have reviewed the triage vital signs and the nursing notes.  Pertinent labs & imaging results that were available during my care of the patient were reviewed by me and considered in my medical decision making (see chart for details).     32 yo F with a chief complaint of shortness of breath.  Going on for the past 3 hours.  Patient arrived tripoding with diffuse wheezes in all lungs.  Given a continuous treatment with 2 doses of ipratropium.  Given magnesium and prednisone.  Patient has improvement but she is still having retractions and increased work of breathing.  Not requiring oxygen.  Will discuss with the hospitalist for admission.  I discussed with the hospitalist, they recommended giving Solu-Medrol on top of the prednisone.  CRITICAL CARE Performed by: Rae Roam   Total critical care time: 80 minutes  Critical care time was exclusive of separately billable procedures and treating other patients.  Critical care was necessary to treat or prevent imminent or life-threatening deterioration.  Critical care was time spent personally by me on the following activities: development of treatment plan with patient and/or surrogate as well as nursing, discussions with consultants, evaluation of patient's response to treatment, examination of patient, obtaining history from patient or surrogate, ordering and performing treatments and interventions, ordering and review of laboratory studies, ordering and review of radiographic studies, pulse oximetry and re-evaluation of patient's condition.  The patients results and plan were reviewed and discussed.   Any x-rays performed  were independently reviewed by myself.   Differential diagnosis were considered with the presenting HPI.  Medications  albuterol (PROVENTIL,VENTOLIN) solution continuous neb (10 mg/hr Nebulization New Bag/Given 08/11/18 1509)  albuterol (PROVENTIL,VENTOLIN) solution continuous neb (15 mg/hr Nebulization Given 08/11/18 1308)  ipratropium (ATROVENT) nebulizer solution 0.5 mg (0.5 mg Nebulization Given 08/11/18 1308)  ipratropium (ATROVENT) nebulizer solution 0.5 mg (0.5 mg Nebulization Given 08/11/18 1308)  magnesium sulfate IVPB 2 g 50 mL ( Intravenous Stopped 08/11/18 1402)  predniSONE (DELTASONE) tablet 60 mg (60 mg Oral Given 08/11/18 1431)    Vitals:   08/11/18 1258 08/11/18 1259  BP: (!) 192/116   Pulse: (!) 123   Resp: (!) 28   Temp: 98.3 F (36.8 C)   TempSrc: Oral   SpO2: 100%   Weight:  66.2 kg  Height:  5\' 4"  (1.626 m)    Final diagnoses:  Moderate persistent asthma with exacerbation    Admission/ observation were discussed with the admitting physician, patient and/or family and they are comfortable with the plan.   Final Clinical Impressions(s) / ED Diagnoses   Final diagnoses:  Moderate persistent asthma with exacerbation    ED Discharge Orders    None         Melene Plan, DO 08/11/18 1529

## 2018-08-11 NOTE — ED Triage Notes (Signed)
Reports shortness of breath which began today.  Patient with audible wheezing-brought straight to room 7.  RT at the bedside.  Patient appears anxious.  History of asthma but no episodes this severe.  States she used inhaler and nebulizer without relief.

## 2018-08-11 NOTE — H&P (Signed)
History and Physical    Deanna Owens AOZ:[id number] DOB: 1986-12-05 DOA: 08/11/2018  Referring MD/NP/PA: Melene Plan, MD  PCP: [facility]   Patient coming from: [facility]  Chief Complaint: Shortness of breath  HPI: Deanna Owens is a 32 y.o. female with medical history significant of asthma, hypertension, migraine headaches, Bell's palsy, polysubstance abuse who presented to med [facility] with significant shortness of breath and cough.  She was found to be wheezing, hypoxic as well as using extra muscle respiration.   Patient was diagnosed with acute exacerbation of asthma.  She was treated at [facility] without relief.  Patient is therefore transferred for inpatient care.  ED Course: Patient's vitals include temperature of 98.3, blood pressure 192/116, pulse 123, respiratory 28, oxygen sat 99% on 2 L.  She has a normal white count normal hemoglobin and platelets but potassium 3.3.  Calcium 8.5 and glucose 116.  Chest x-ray showed no active finding.  Patient treated with IV Solu-Medrol as well as nebulizer but not turning around so she is being admitted for inpatient care  Review of Systems: As per HPI otherwise 10 point review of systems negative.    Past Medical History:  Diagnosis Date  . Asthma   . Bell's palsy   . Hypertension   . Migraine     Past Surgical History:  Procedure Laterality Date  . HERNIA REPAIR       reports that she has quit smoking. Her smoking use included cigarettes. She smoked 0.50 packs per day. She has never used smokeless tobacco. She reports that she drinks alcohol. She reports that she has current or past drug history. Drug: Marijuana.  Allergies  Allergen Reactions  . Tramadol Anaphylaxis  . Propranolol     History reviewed. No pertinent family history.   Prior to Admission medications   Medication Sig Start Date End Date Taking? Authorizing Provider  ALBUTEROL IN Inhale into the  lungs.    [provider]  amLODipine (NORVASC) 5 MG tablet Take 1 tablet (5 mg total) by mouth daily. 06/06/17   Gilda Crease, MD  cephALEXin (KEFLEX) 500 MG capsule Take 1 capsule (500 mg total) by mouth 4 (four) times daily. 07/08/17   Joy, Shawn C, PA-C  Fluticasone Propionate, Inhal, (FLOVENT IN) Inhale 1 puff into the lungs 2 (two) times daily.     [provider]  GABAPENTIN PO Take by mouth.    [provider]  HYDRALAZINE-HCTZ PO Take by mouth.    [provider]  ibuprofen (ADVIL,MOTRIN) 600 MG tablet Take 1 tablet (600 mg total) by mouth every 6 (six) hours as needed. 07/08/17   Anselm Pancoast, PA-C    Physical Exam: Vitals:   08/11/18 1600 08/11/18 1630 08/11/18 1815 08/11/18 1818  BP: (!) 168/100 (!) 165/90  (!) 168/93  Pulse: (!) 121 (!) 119  (!) 118  Resp: (!) 21 20  (!) 22  Temp:    97.7 F (36.5 C)  TempSrc:    Oral  SpO2: 100% 100%  99%  Weight:   65.5 kg   Height:   5\' 4"  (1.626 m)       Constitutional: NAD, calm, comfortable Vitals:   08/11/18 1600 08/11/18 1630 08/11/18 1815 08/11/18 1818  BP: (!) 168/100 (!) 165/90  (!) 168/93  Pulse: (!) 121 (!) 119  (!) 118  Resp: (!) 21 20  (!) 22  Temp:    97.7 F (  36.5 C)  TempSrc:    Oral  SpO2: 100% 100%  99%  Weight:   65.5 kg   Height:   5\' 4"  (1.626 m)    Eyes: PERRL, lids and conjunctivae normal ENMT: Mucous membranes are moist. Posterior pharynx clear of any exudate or lesions.Normal dentition.  Neck: normal, supple, no masses, no thyromegaly Respiratory: Decreased air entry bilaterally with marked expiratory wheezing, no crackles. Cardiovascular: Regular rate and rhythm, no murmurs / rubs / gallops. No extremity edema. 2+ pedal pulses. No carotid bruits.  Abdomen: no tenderness, no masses palpated. No hepatosplenomegaly. Bowel sounds positive.  Musculoskeletal: no clubbing / cyanosis. No joint deformity upper and lower extremities. Good ROM, no contractures. Normal  muscle tone.  Skin: no rashes, lesions, ulcers. No induration Neurologic: CN 2-12 grossly intact. Sensation intact, DTR normal. Strength 5/5 in all 4.  Psychiatric: Normal judgment and insight. Alert and oriented x 3. Normal mood.   Labs on Admission: I have personally reviewed following labs and imaging studies  CBC: Recent Labs  Lab 08/11/18 1340  WBC 7.9  NEUTROABS 5.6  HGB 13.3  HCT 39.3  MCV 92.5  PLT 206   Basic Metabolic Panel: Recent Labs  Lab 08/11/18 1340  NA 142  K 3.3*  CL 106  CO2 26  GLUCOSE 116*  BUN 12  CREATININE 0.74  CALCIUM 8.5*   GFR: Estimated Creatinine Clearance: 85.6 mL/min (by C-G formula based on SCr of 0.74 mg/dL). Liver Function Tests: No results for input(s): AST, ALT, ALKPHOS, BILITOT, PROT, ALBUMIN in the last 168 hours. No results for input(s): LIPASE, AMYLASE in the last 168 hours. No results for input(s): AMMONIA in the last 168 hours. Coagulation Profile: No results for input(s): INR, PROTIME in the last 168 hours. Cardiac Enzymes: No results for input(s): CKTOTAL, CKMB, CKMBINDEX, TROPONINI in the last 168 hours. BNP (last 3 results) No results for input(s): PROBNP in the last 8760 hours. HbA1C: No results for input(s): HGBA1C in the last 72 hours. CBG: No results for input(s): GLUCAP in the last 168 hours. Lipid Profile: No results for input(s): CHOL, HDL, LDLCALC, TRIG, CHOLHDL, LDLDIRECT in the last 72 hours. Thyroid Function Tests: No results for input(s): TSH, T4TOTAL, FREET4, T3FREE, THYROIDAB in the last 72 hours. Anemia Panel: No results for input(s): VITAMINB12, FOLATE, FERRITIN, TIBC, IRON, RETICCTPCT in the last 72 hours. Urine analysis:    Component Value Date/Time   COLORURINE YELLOW 04/24/2017 1715   APPEARANCEUR CLEAR 04/24/2017 1715   LABSPEC 1.014 04/24/2017 1715   PHURINE 7.0 04/24/2017 1715   GLUCOSEU NEGATIVE 04/24/2017 1715   HGBUR NEGATIVE 04/24/2017 1715   BILIRUBINUR NEGATIVE 04/24/2017 1715    KETONESUR NEGATIVE 04/24/2017 1715   PROTEINUR NEGATIVE 04/24/2017 1715   NITRITE NEGATIVE 04/24/2017 1715   LEUKOCYTESUR TRACE (A) 04/24/2017 1715   Sepsis Labs: @LABRCNTIP (procalcitonin:4,lacticidven:4) )No results found for this or any previous visit (from the past 240 hour(s)).   Radiological Exams on Admission: Dg Chest Portable 1 View  Result Date: 08/11/2018 CLINICAL DATA:  32 year old female with a history of shortness of breath EXAM: PORTABLE CHEST 1 VIEW COMPARISON:  04/25/2017, 12/21/2016 FINDINGS: The heart size and mediastinal contours are within normal limits. Both lungs are clear. The visualized skeletal structures are unremarkable. IMPRESSION: Negative for acute cardiopulmonary disease Electronically Signed   By: Gilmer Mor D.O.   On: 08/11/2018 13:43    Assessment/Plan Principal Problem:   Asthma exacerbation Active Problems:   Polysubstance abuse (HCC)   Hypertensive urgency  Hypokalemia   Asthma attack    #1 acute exacerbation of asthma: Patient will be admitted to a monitored floor.  Will initiate IV Solu-Medrol, nebulizer treatment as well as oxygen.  We will hold off on antibiotics for now.  #2 hypertensive urgency: Patient has not been compliant with her medications.  Initiate home amlodipine and hydralazine.  IV medications will be ordered as needed if no adequate response.  #3 hypokalemia: Replete potassium.  #4 history of polysubstance abuse: We will order urine drug screen.  Counseling will also be provided.   DVT prophylaxis: SCD   Code Status: Full code   Family Communication: No family available   Disposition Plan: Home when ready   Consults called: None   Admission status: Inpatient   Severity of Illness: The appropriate patient status for this patient is INPATIENT. Inpatient status is judged to be reasonable and necessary in order to provide the required intensity of service to ensure the patient's safety. The patient's presenting  symptoms, physical exam findings, and initial radiographic and laboratory data in the context of their chronic comorbidities is felt to place them at high risk for further clinical deterioration. Furthermore, it is not anticipated that the patient will be medically stable for discharge from the hospital within 2 midnights of admission. The following factors support the patient status of inpatient.   " The patient's presenting symptoms include shortness of breath. " The worrisome physical exam findings include marked expiratory wheezing. " The initial radiographic and laboratory data are worrisome because of no evidence of pneumonia. " The chronic co-morbidities include history of asthma.   * I certify that at the point of admission it is my clinical judgment that the patient will require inpatient hospital care spanning beyond 2 midnights from the point of admission due to high intensity of service, high risk for further deterioration and high frequency of surveillance required.Lonia Blood*    GARBA,LAWAL MD [facility] Pager [phone]  If 7PM-7AM, please contact night-coverage [email or web address] Password [facility] [email or web address]  08/11/2018, 6:42 PM

## 2018-08-11 NOTE — ED Notes (Signed)
[facility] arrived for transport for pt- received different call unable to transport.

## 2018-08-11 NOTE — ED Notes (Signed)
ED Provider at bedside. 

## 2018-08-11 NOTE — Progress Notes (Signed)
[facility] Dr. Fransico Settersaniel Floyd.  Patient is a 32 year old with a history of history of migraines, history of Bell's palsy who presents with severe shortness of breath.  Has been acutely over 3 hours and try to turn her hours at home without improvement.  Presented to the ED and given continuous neb treatments along with 2 doses of ipratropium.  Also given prednisone but then after that was given Solu-Medrol for wheezing.  Accepted patient to a telemetry bed for observation given her acute asthma exacerbation.

## 2018-08-12 ENCOUNTER — Encounter (HOSPITAL_COMMUNITY): Payer: Self-pay

## 2018-08-12 ENCOUNTER — Encounter: Payer: Self-pay | Admitting: Family Medicine

## 2018-08-12 DIAGNOSIS — J4521 Mild intermittent asthma with (acute) exacerbation: Secondary | ICD-10-CM

## 2018-08-12 LAB — COMPREHENSIVE METABOLIC PANEL
ALBUMIN: 3.7 g/dL (ref 3.5–5.0)
ALK PHOS: 45 U/L (ref 38–126)
ALT: 18 U/L (ref 0–44)
ANION GAP: 12 (ref 5–15)
AST: 27 U/L (ref 15–41)
BUN: 9 mg/dL (ref 6–20)
CO2: 17 mmol/L — ABNORMAL LOW (ref 22–32)
Calcium: 8.9 mg/dL (ref 8.9–10.3)
Chloride: 109 mmol/L (ref 98–111)
Creatinine, Ser: 0.71 mg/dL (ref 0.44–1.00)
GFR calc Af Amer: >60 mL/min (ref >60)
GFR calc non Af Amer: >60 mL/min (ref >60)
GLUCOSE: 156 mg/dL — AB (ref 70–99)
POTASSIUM: 3.7 mmol/L (ref 3.5–5.1)
Sodium: 138 mmol/L (ref 135–145)
Total Bilirubin: 0.6 mg/dL (ref 0.3–1.2)
Total Protein: 6.5 g/dL (ref 6.5–8.1)

## 2018-08-12 LAB — CBC
HEMATOCRIT: 36.6 % (ref 36.0–46.0)
HEMOGLOBIN: 12.2 g/dL (ref 12.0–15.0)
MCH: 31.3 pg (ref 26.0–34.0)
MCHC: 33.3 g/dL (ref 30.0–36.0)
MCV: 93.8 fL (ref 78.0–100.0)
Platelets: 232 10*3/uL (ref 150–400)
RBC: 3.9 MIL/uL (ref 3.87–5.11)
RDW: 13.4 % (ref 11.5–15.5)
WBC: 9.4 10*3/uL (ref 4.0–10.5)

## 2018-08-12 LAB — HIV ANTIBODY (ROUTINE TESTING W REFLEX): HIV Screen 4th Generation wRfx: NONREACTIVE

## 2018-08-12 MED ORDER — PREDNISONE 20 MG PO TABS: 40.0000 mg | ORAL_TABLET | Freq: Every day | ORAL | 0 refills | Status: DC

## 2018-08-12 MED ORDER — PREDNISONE 20 MG PO TABS: 40.0000 mg | ORAL_TABLET | Freq: Every day | ORAL | Status: DC

## 2018-08-12 NOTE — Discharge Summary (Signed)
Physician Discharge Summary  Deanna Owens ZOX:[id number] DOB: 08-10-1986 DOA: 08/11/2018  PCP: [facility]  Admit date: 08/11/2018 Discharge date: 08/12/2018  Time spent: 30 minutes  Recommendations for Outpatient Follow-up:  1. Needs smoking and Cocaine cessation counselling 2. Only med change Prednisone burst-has inhalers and nebs at home  Discharge Diagnoses:  Principal Problem:   Asthma exacerbation Active Problems:   Polysubstance abuse (HCC)   Hypertensive urgency   Hypokalemia   Asthma attack   Discharge Condition: imrpvoed  Diet recommendation: hh  Filed Weights   08/11/18 1259 08/11/18 1815  Weight: 66.2 kg 65.5 kg    History of present illness:   32 year old female, known light smoker Childhood asthma, HTN, Bell's palsy,  polysubstance abuse, reported migraine history [facility] with severe asthma exacerbation not status epilepticus-given continuous treatment ipratropium magnesium and prednisone had no improvement still had retractions and was admitted On admission normal white count potassium 3.3   A & Plan Acute asthma exacerbation-much improved-no wheeze ambulated in [place] all the while talkignm with sats high 90 to 100 range-prednsione Rx on d/c Tobacco abuse-counseled to quit Hypertensive urgency noncompliant on meds--should resume meds and follow up as OP Cocaine positive-states she occasionally parties-had bday party on Sunday-snorts-counselled re: cocaine lung     Discharge Exam: Vitals:   08/12/18 0848 08/12/18 1000  BP:    Pulse:  (!) 124  Resp:    Temp:    SpO2: 98% 100%    General: awale alert talkative no distress Cardiovascular: s1 s 2no m tachy Respiratory: clear no wheeze no rales no rhonchi no added sound  Discharge Instructions   Discharge Instructions    Diet - low sodium heart healthy   Complete by:  As directed    Discharge instructions   Complete by:  As  directed    Stop smoking Finish prednisone Follow up as an outpatient with reg Doc   Increase activity slowly   Complete by:  As directed      Allergies as of 08/12/2018      Reactions   Ketorolac Anaphylaxis   Lisinopril Swelling   Tramadol Anaphylaxis   Propranolol       Medication List    STOP taking these medications   amLODipine 5 MG tablet Commonly known as:  NORVASC   cephALEXin 500 MG capsule Commonly known as:  KEFLEX   ibuprofen 600 MG tablet Commonly known as:  ADVIL,MOTRIN   lidocaine 5 % Commonly known as:  LIDODERM     TAKE these medications   albuterol 1.25 MG/3ML nebulizer solution Commonly known as:  ACCUNEB Inhale 3 mLs into the lungs every 4 (four) hours as needed for wheezing.   PROAIR HFA 108 (90 Base) MCG/ACT inhaler Generic drug:  albuterol Inhale 2 puffs into the lungs. Every 4 to 6 hours for wheezing or shortness of breath   ALBUTEROL IN Inhale into the lungs.   cyclobenzaprine 10 MG tablet Commonly known as:  FLEXERIL Take 10 mg by mouth 2 (two) times daily as needed for muscle spasms.   FLOVENT IN Inhale 1 puff into the lungs 2 (two) times daily.   gabapentin 600 MG tablet Commonly known as:  NEURONTIN Take 600 mg by mouth 3 (three) times daily.   HYDRALAZINE-HCTZ PO Take by mouth.   ipratropium-albuterol 0.5-2.5 (3) MG/3ML Soln Commonly known as:  DUONEB Use 1 vial via nebulizer 4 times daily and as needed for 5 days  naproxen 250 MG tablet Commonly known as:  NAPROSYN Take 250 mg by mouth daily as needed (back pain).   predniSONE 20 MG tablet Commonly known as:  DELTASONE Take 2 tablets (40 mg total) by mouth daily with breakfast.      Allergies  Allergen Reactions  . Ketorolac Anaphylaxis  . Lisinopril Swelling  . Tramadol Anaphylaxis  . Propranolol       The results of significant diagnostics from this hospitalization (including imaging, microbiology, ancillary and laboratory) are listed below for  reference.    Significant Diagnostic Studies: Dg Chest Portable 1 View  Result Date: 08/11/2018 CLINICAL DATA:  32 year old female with a history of shortness of breath EXAM: PORTABLE CHEST 1 VIEW COMPARISON:  04/25/2017, 12/21/2016 FINDINGS: The heart size and mediastinal contours are within normal limits. Both lungs are clear. The visualized skeletal structures are unremarkable. IMPRESSION: Negative for acute cardiopulmonary disease Electronically Signed   By: Gilmer Mor D.O.   On: 08/11/2018 13:43    Microbiology: No results found for this or any previous visit (from the past 240 hour(s)).   Labs: Basic Metabolic Panel: Recent Labs  Lab 08/11/18 1340 08/12/18 0526  NA 142 138  K 3.3* 3.7  CL 106 109  CO2 26 17*  GLUCOSE 116* 156*  BUN 12 9  CREATININE 0.74 0.71  CALCIUM 8.5* 8.9   Liver Function Tests: Recent Labs  Lab 08/12/18 0526  AST 27  ALT 18  ALKPHOS 45  BILITOT 0.6  PROT 6.5  ALBUMIN 3.7   No results for input(s): LIPASE, AMYLASE in the last 168 hours. No results for input(s): AMMONIA in the last 168 hours. CBC: Recent Labs  Lab 08/11/18 1340 08/12/18 0526  WBC 7.9 9.4  NEUTROABS 5.6  --   HGB 13.3 12.2  HCT 39.3 36.6  MCV 92.5 93.8  PLT 206 232   Cardiac Enzymes: No results for input(s): CKTOTAL, CKMB, CKMBINDEX, TROPONINI in the last 168 hours. BNP: BNP (last 3 results) No results for input(s): BNP in the last 8760 hours.  ProBNP (last 3 results) No results for input(s): PROBNP in the last 8760 hours.  CBG: No results for input(s): GLUCAP in the last 168 hours.     Signed:  Rhetta Mura MD   [facility] 08/12/2018, 10:20 AM

## 2018-08-12 NOTE — Progress Notes (Signed)
SATURATION QUALIFICATIONS: (This note is used to comply with regulatory documentation for home oxygen)  Patient Saturations on Room Air at Rest = 100  Patient Saturations on Room Air while Ambulating = 100  Patient Saturations on Liters of oxygen while Ambulating =   Please briefly explain why patient needs home oxygen: Patient 100% RA while ambulating. No SOB with exertion

## 2018-08-12 NOTE — Care Management Note (Signed)
Case Management Note  Patient Details  Name: Deanna Owens MRN: [id number] Date of Birth: 06/01/1986  Subjective/Objective:  Admitted w/Asthma. From home. Has pcp(make own appt),pharmacy,transp.No CM needs.                  Action/Plan:d/c home.   Expected Discharge Date:  08/12/18               Expected Discharge Plan:  Home/Self Care  In-House Referral:     Discharge planning Services  CM Consult  Post Acute Care Choice:    Choice offered to:     DME Arranged:    DME Agency:     HH Arranged:    HH Agency:     Status of Service:  Completed, signed off  If discussed at [employer] Length of Stay Meetings, dates discussed:    Additional Comments:  Deanna Owens, Deanna Curless, RN 08/12/2018, 10:14 AM

## 2019-05-18 ENCOUNTER — Ambulatory Visit: Payer: Medicaid Other | Admitting: Allergy and Immunology

## 2019-05-25 ENCOUNTER — Ambulatory Visit: Payer: Medicaid Other | Admitting: Allergy and Immunology

## 2020-02-23 ENCOUNTER — Telehealth: Payer: Self-pay

## 2020-02-23 ENCOUNTER — Other Ambulatory Visit: Payer: Self-pay

## 2020-02-23 ENCOUNTER — Ambulatory Visit: Payer: Medicaid Other | Admitting: Allergy and Immunology

## 2020-02-23 ENCOUNTER — Encounter: Payer: Self-pay | Admitting: Allergy and Immunology

## 2020-02-23 VITALS — BP 136/96 | HR 83 | Temp 98.3°F | Resp 16 | Ht 66.0 in | Wt 150.6 lb

## 2020-02-23 DIAGNOSIS — T7800XA Anaphylactic reaction due to unspecified food, initial encounter: Secondary | ICD-10-CM | POA: Diagnosis not present

## 2020-02-23 DIAGNOSIS — J4551 Severe persistent asthma with (acute) exacerbation: Secondary | ICD-10-CM | POA: Diagnosis not present

## 2020-02-23 DIAGNOSIS — R030 Elevated blood-pressure reading, without diagnosis of hypertension: Secondary | ICD-10-CM

## 2020-02-23 DIAGNOSIS — J3089 Other allergic rhinitis: Secondary | ICD-10-CM

## 2020-02-23 DIAGNOSIS — H1010 Acute atopic conjunctivitis, unspecified eye: Secondary | ICD-10-CM | POA: Insufficient documentation

## 2020-02-23 DIAGNOSIS — H1013 Acute atopic conjunctivitis, bilateral: Secondary | ICD-10-CM | POA: Diagnosis not present

## 2020-02-23 MED ORDER — LEVOCETIRIZINE DIHYDROCHLORIDE 5 MG PO TABS: 5.0000 mg | ORAL_TABLET | Freq: Every day | ORAL | 5 refills | Status: DC | PRN

## 2020-02-23 MED ORDER — PROAIR HFA 108 (90 BASE) MCG/ACT IN AERS: 2.0000 | INHALATION_SPRAY | RESPIRATORY_TRACT | 1 refills | Status: DC | PRN

## 2020-02-23 MED ORDER — AZELASTINE HCL 0.1 % NA SOLN: NASAL | 5 refills | Status: AC

## 2020-02-23 MED ORDER — OLOPATADINE HCL 0.2 % OP SOLN: OPHTHALMIC | 5 refills | Status: DC

## 2020-02-23 MED ORDER — EPINEPHRINE 0.3 MG/0.3ML IJ SOAJ: INTRAMUSCULAR | 3 refills | Status: DC

## 2020-02-23 MED ORDER — MONTELUKAST SODIUM 10 MG PO TABS: 10.0000 mg | ORAL_TABLET | Freq: Every day | ORAL | 5 refills | Status: AC

## 2020-02-23 MED ORDER — SPIRIVA RESPIMAT 1.25 MCG/ACT IN AERS: 2.0000 | INHALATION_SPRAY | Freq: Every day | RESPIRATORY_TRACT | 5 refills | Status: DC

## 2020-02-23 MED ORDER — DULERA 200-5 MCG/ACT IN AERO: INHALATION_SPRAY | RESPIRATORY_TRACT | 5 refills | Status: DC

## 2020-02-23 NOTE — Assessment & Plan Note (Addendum)
The patient's history suggests food allergy and positive skin test results today confirm this diagnosis.  Food allergen skin testing revealed reactivity to peanut, sesame seed, walnut, almond, hazelnut, [place] nut, and pistachio.  Meticulous avoidance of peanuts, tree nuts, sesame seed, and sunflower seed as discussed.  As lab work is being done for other purposes, we will check serum specific IgE levels for peanut with reflex components, tree nut panel with reflex components, sesame seed, sunflower seed, and shellfish panel.  A prescription has been provided for epinephrine auto-injector 2 pack along with instructions for proper administration.  A food allergy action plan has been provided and discussed.  Medic Alert identification is recommended.

## 2020-02-23 NOTE — Assessment & Plan Note (Addendum)
Currently with suboptimal control.  The patient's insurance does not cover Spiriva Respimat.  A prescription has been provided for Stiolto Respimat, 2 inhalations once daily.  A prescription has been provided for Flovent 220 mcg, 2 inhalations twice daily.  To maximize pulmonary deposition, a spacer has been provided along with instructions for its proper administration with an HFA inhaler.  Discontinue Dulera.  To maximize pulmonary deposition, a spacer has been provided along with instructions for its proper administration with an HFA inhaler.  A prescription has been provided for Spiriva Respimat 1.25 g, 2 inhalations daily.  Continue montelukast 10 mg daily and albuterol HFA, 1 to 2 inhalations every 4-6 hours as needed.  A laboratory order form has been provided for CBC with differential and serum IgE level.  The patient has been asked to contact me if her symptoms persist or progress. Otherwise, she may return for follow up in 6 weeks.

## 2020-02-23 NOTE — Assessment & Plan Note (Signed)
   Treatment plan as outlined above for allergic rhinitis.  A prescription has been provided for Pataday, one drop per eye daily as needed.  I have also recommended eye lubricant drops (i.e., Natural Tears) as needed. 

## 2020-02-23 NOTE — Patient Instructions (Addendum)
Food allergy The patient's history suggests food allergy and positive skin test results today confirm this diagnosis.  Food allergen skin testing revealed reactivity to peanut, sesame seed, walnut, almond, hazelnut, [place] nut, and pistachio.  Meticulous avoidance of peanuts, tree nuts, sesame seed, and sunflower seed as discussed.  As lab work is being done for other purposes, we will check serum specific IgE levels for peanut with reflex components, tree nut panel with reflex components, sesame seed, sunflower seed, and shellfish panel.  A prescription has been provided for epinephrine auto-injector 2 pack along with instructions for proper administration.  A food allergy action plan has been provided and discussed.  Medic Alert identification is recommended.  Severe persistent asthma Currently with suboptimal control.  The patient's insurance does not cover Spiriva Respimat.  A prescription has been provided for Stiolto Respimat, 2 inhalations once daily.  A prescription has been provided for Flovent 220 mcg, 2 inhalations twice daily.  To maximize pulmonary deposition, a spacer has been provided along with instructions for its proper administration with an HFA inhaler.  Discontinue Dulera.  To maximize pulmonary deposition, a spacer has been provided along with instructions for its proper administration with an HFA inhaler.  A prescription has been provided for Spiriva Respimat 1.25 g, 2 inhalations daily.  Continue montelukast 10 mg daily and albuterol HFA, 1 to 2 inhalations every 4-6 hours as needed.  A laboratory order form has been provided for CBC with differential and serum IgE level.  The patient has been asked to contact me if her symptoms persist or progress. Otherwise, she may return for follow up in 6 weeks.  Seasonal and perennial allergic rhinitis  Aeroallergen avoidance measures have been discussed and provided in written form.  A prescription has been  provided for levocetirizine(Xyzal), 5 mg daily as needed.  A prescription has been provided for azelastine nasal spray, 1-2 sprays per nostril 2 times daily as needed. Proper nasal spray technique has been discussed and demonstrated.   Nasal saline spray (i.e., Simply Saline) or nasal saline lavage (i.e., NeilMed) is recommended as needed and prior to medicated nasal sprays.  The risks and benefits of aeroallergen immunotherapy have been discussed. The patient is motivated to initiate immunotherapy if insurance coverage is favorable. She will let us know how she would like to proceed.  Allergic conjunctivitis  Treatment plan as outlined above for allergic rhinitis.  A prescription has been provided for Pataday, one drop per eye daily as needed.  I have also recommended eye lubricant drops (i.e., Natural Tears) as needed.  Elevated blood pressure reading  The patient has been made aware of the elevated blood pressure reading and has been encouraged to follow up with her primary care physician in the near future regarding this issue.    Return in about 6 weeks (around 04/05/2020), or if symptoms worsen or fail to improve.  Control of Dust Mite Allergen  House dust mites play a major role in allergic asthma and rhinitis.  They occur in environments with high humidity wherever human skin, the food for dust mites is found. High levels have been detected in dust obtained from mattresses, pillows, carpets, upholstered furniture, bed covers, clothes and soft toys.  The principal allergen of the house dust mite is found in its feces.  A gram of dust may contain 1,000 mites and 250,000 fecal particles.  Mite antigen is easily measured in the air during house cleaning activities.    1. Encase mattresses, including the box spring,  and pillow, in an air tight cover.  Seal the zipper end of the encased mattresses with wide adhesive tape. 2. Wash the bedding in water of 130 degrees Farenheit weekly.   Avoid cotton comforters/quilts and flannel bedding: the most ideal bed covering is the dacron comforter. 3. Remove all upholstered furniture from the bedroom. 4. Remove carpets, carpet padding, rugs, and non-washable window drapes from the bedroom.  Wash drapes weekly or use plastic window coverings. 5. Remove all non-washable stuffed toys from the bedroom.  Wash stuffed toys weekly. 6. Have the room cleaned frequently with a vacuum cleaner and a damp dust-mop.  The patient should not be in a room which is being cleaned and should wait 1 hour after cleaning before going into the room. 7. Close and seal all heating outlets in the bedroom.  Otherwise, the room will become filled with dust-laden air.  An electric heater can be used to heat the room. Reduce indoor humidity to less than 50%.  Do not use a humidifier.   Reducing Pollen Exposure  The American Academy of Allergy, Asthma and Immunology suggests the following steps to reduce your exposure to pollen during allergy seasons.    1. Do not hang sheets or clothing out to dry; pollen may collect on these items. 2. Do not mow lawns or spend time around freshly cut grass; mowing stirs up pollen. 3. Keep windows closed at night.  Keep car windows closed while driving. 4. Minimize morning activities outdoors, a time when pollen counts are usually at their highest. 5. Stay indoors as much as possible when pollen counts or humidity is high and on windy days when pollen tends to remain in the air longer. 6. Use air conditioning when possible.  Many air conditioners have filters that trap the pollen spores. 7. Use a HEPA room air filter to remove pollen form the indoor air you breathe.   Control of Dog or Cat Allergen  Avoidance is the best way to manage a dog or cat allergy. If you have a dog or cat and are allergic to dog or cats, consider removing the dog or cat from the home. If you have a dog or cat but don't want to find it a new home, or if  your family wants a pet even though someone in the household is allergic, here are some strategies that may help keep symptoms at bay:  1. Keep the pet out of your bedroom and restrict it to only a few rooms. Be advised that keeping the dog or cat in only one room will not limit the allergens to that room. 2. Don't pet, hug or kiss the dog or cat; if you do, wash your hands with soap and water. 3. High-efficiency particulate air (HEPA) cleaners run continuously in a bedroom or living room can reduce allergen levels over time. 4. Place electrostatic material sheet in the air inlet vent in the bedroom. 5. Regular use of a high-efficiency vacuum cleaner or a central vacuum can reduce allergen levels. 6. Giving your dog or cat a bath at least once a week can reduce airborne allergen.   Control of Mold Allergen  Mold and fungi can grow on a variety of surfaces provided certain temperature and moisture conditions exist.  Outdoor molds grow on plants, decaying vegetation and soil.  The major outdoor mold, Alternaria and Cladosporium, are found in very high numbers during hot and dry conditions.  Generally, a late Summer - Fall peak is seen for common  outdoor fungal spores.  Rain will temporarily lower outdoor mold spore count, but counts rise rapidly when the rainy period ends.  The most important indoor molds are Aspergillus and Penicillium.  Dark, humid and poorly ventilated basements are ideal sites for mold growth.  The next most common sites of mold growth are the bathroom and the kitchen.  Outdoor [employer] 1. Use air conditioning and keep windows closed 2. Avoid exposure to decaying vegetation. 3. Avoid leaf raking. 4. Avoid grain handling. 5. Consider wearing a face mask if working in moldy areas.  Indoor Mold Control 1. Maintain humidity below 50%. 2. Clean washable surfaces with 5% bleach solution. 3. Remove sources e.g. Contaminated carpets.   Control of Cockroach  Allergen  Cockroach allergen has been identified as an important cause of acute attacks of asthma, especially in urban settings.  There are fifty-five species of cockroach that exist in the [place], however only three, the [place], [place] species produce allergen that can affect patients with Asthma.  Allergens can be obtained from fecal particles, egg casings and secretions from cockroaches.    1. Remove food sources. 2. Reduce access to water. 3. Seal access and entry points. 4. Spray runways with 0.5-1% Diazinon or Chlorpyrifos 5. Blow boric acid power under stoves and refrigerator. 6. Place bait stations (hydramethylnon) at feeding sites.

## 2020-02-23 NOTE — Telephone Encounter (Signed)
pts insurance does not cover sprivia. combivent and stiolto respimat are covered per formulary. Please advise to change

## 2020-02-23 NOTE — Progress Notes (Signed)
New Patient Note  RE: Deanna Owens MRN: [id number] DOB: [birth year] Date of Office Visit: 02/23/2020  Referring provider: Graciela Husbands, FNP Primary care provider: List, Nash Shearer, FNP  Chief Complaint: Allergic Rhinitis , Asthma, and Food Intolerance   History of present illness: Deanna Owens is a 34 y.o. female seen today in consultation requested by Graciela Husbands, FNP.  She reports that approximately 3 weeks ago she consumed spaghetti, sunflower seeds, hot sauce, and shrimp.  She noticed a swelling sensation on the insides of her cheeks and approximately 30 minutes later developed hives on her arms, face, and back.  She took diphenhydramine and applied calamine lotion and the symptoms gradually resolved over the next several hours without further intervention.  She did not experience cardiopulmonary or GI symptoms.  She has avoided shrimp since that time.  She notes that a few days later she consumed sunflower seeds and experienced the sensation of slight swelling of the inside of her cheeks.  On questioning, she admits to oropharyngeal pruritus with the consumption of peanuts and tree nuts. Xela experiences nasal congestion, rhinorrhea, sneezing, postnasal drainage, nasal pruritus, and ocular pruritus.  The symptoms are most frequent and severe with pollen exposure in the springtime and in the fall.  She attempts to control the symptoms with cetirizine and montelukast. Naylee has had asthma since childhood.  She reports that despite using Advair Diskus in the morning and Dulera in the evening and taking montelukast daily, she still requires albuterol 2-4 times per day stating that she has been "going through like water."  She reports that her asthma symptoms are most frequent and severe with pollen exposure.  Assessment and plan: Food allergy The patient's history suggests food allergy and positive skin test results today confirm this diagnosis.  Food allergen skin testing revealed reactivity  to peanut, sesame seed, walnut, almond, hazelnut, [place] nut, and pistachio.  Meticulous avoidance of peanuts, tree nuts, sesame seed, and sunflower seed as discussed.  As lab work is being done for other purposes, we will check serum specific IgE levels for peanut with reflex components, tree nut panel with reflex components, sesame seed, sunflower seed, and shellfish panel.  A prescription has been provided for epinephrine auto-injector 2 pack along with instructions for proper administration.  A food allergy action plan has been provided and discussed.  Medic Alert identification is recommended.  Severe persistent asthma Currently with suboptimal control.  The patient's insurance does not cover Spiriva Respimat.  A prescription has been provided for Stiolto Respimat, 2 inhalations once daily.  A prescription has been provided for Flovent 220 mcg, 2 inhalations twice daily.  To maximize pulmonary deposition, a spacer has been provided along with instructions for its proper administration with an HFA inhaler.  Discontinue Dulera.  To maximize pulmonary deposition, a spacer has been provided along with instructions for its proper administration with an HFA inhaler.  A prescription has been provided for Spiriva Respimat 1.25 g, 2 inhalations daily.  Continue montelukast 10 mg daily and albuterol HFA, 1 to 2 inhalations every 4-6 hours as needed.  A laboratory order form has been provided for CBC with differential and serum IgE level.  The patient has been asked to contact me if her symptoms persist or progress. Otherwise, she may return for follow up in 6 weeks.  Seasonal and perennial allergic rhinitis  Aeroallergen avoidance measures have been discussed and provided in written form.  A prescription has been provided for levocetirizine(Xyzal), 5 mg daily as needed.  A prescription has been provided for azelastine nasal spray, 1-2 sprays per nostril 2 times daily as needed.  Proper nasal spray technique has been discussed and demonstrated.   Nasal saline spray (i.e., Simply Saline) or nasal saline lavage (i.e., NeilMed) is recommended as needed and prior to medicated nasal sprays.  The risks and benefits of aeroallergen immunotherapy have been discussed. The patient is motivated to initiate immunotherapy if insurance coverage is favorable. She will let us know how she would like to proceed.  Allergic conjunctivitis  Treatment plan as outlined above for allergic rhinitis.  A prescription has been provided for Pataday, one drop per eye daily as needed.  I have also recommended eye lubricant drops (i.e., Natural Tears) as needed.  Elevated blood pressure reading  The patient has been made aware of the elevated blood pressure reading and has been encouraged to follow up with her primary care physician in the near future regarding this issue.    Meds ordered this encounter  Medications  . EPINEPHrine 0.3 mg/0.3 mL IJ SOAJ injection    Sig: Use as directed for severe allergic reaction.    Dispense:  2 each    Refill:  3    Dispense MYLAN or TEVA epinephrine auto injector  . mometasone-formoterol (DULERA) 200-5 MCG/ACT AERO    Sig: Two puffs with spacer twice a day    Dispense:  13 g    Refill:  5  . Tiotropium Bromide Monohydrate (SPIRIVA RESPIMAT) 1.25 MCG/ACT AERS    Sig: Inhale 2 puffs into the lungs daily.    Dispense:  4 g    Refill:  5  . montelukast (SINGULAIR) 10 MG tablet    Sig: Take 1 tablet (10 mg total) by mouth at bedtime.    Dispense:  30 tablet    Refill:  5  . levocetirizine (XYZAL) 5 MG tablet    Sig: Take 1 tablet (5 mg total) by mouth daily as needed for allergies.    Dispense:  30 tablet    Refill:  5  . PROAIR HFA 108 (90 Base) MCG/ACT inhaler    Sig: Inhale 2 puffs into the lungs every 4 (four) hours as needed for wheezing or shortness of breath.    Dispense:  18 g    Refill:  1  . azelastine (ASTELIN) 0.1 % nasal spray     Sig: One to two sprays each nostril twice a day as needed.    Dispense:  30 mL    Refill:  5  . Olopatadine HCl (PATADAY) 0.2 % SOLN    Sig: One drop each eye once a day as needed for itchy eyes.    Dispense:  2.5 mL    Refill:  5    Diagnostics: Spirometry: FVC was 2.66 L and FEV1 was 2.19 L (77% predicted) with 5% postbronchodilator improvement.  This study was performed while the patient was asymptomatic.  Please see scanned spirometry results for details. Environmental skin testing: Positive to grass pollen, weed pollen, ragweed pollen, tree pollen, molds, cat hair, dog epithelia, cockroach antigen, and dust mite antigen. Food allergen skin testing: Positive to peanut, sesame seed, walnut, almond, hazelnut, [place] nut, and pistachio.    Physical examination: Blood pressure (!) 136/96, pulse 83, temperature 98.3 F (36.8 C), temperature source Oral, resp. rate 16, height 5\' 6"  (1.676 m), weight 150 lb 9.2 oz (68.3 kg), SpO2 100 %.  General: Alert, interactive, in no acute distress. HEENT: TMs pearly gray, turbinates moderately edematous with  thick discharge, post-pharynx moderately erythematous. Neck: Supple without lymphadenopathy. Lungs: Clear to auscultation without wheezing, rhonchi or rales. CV: Normal S1, S2 without murmurs. Abdomen: Nondistended, nontender. Skin: Warm and dry, without lesions or rashes. Extremities:  No clubbing, cyanosis or edema. Neuro:   Grossly intact.  Review of systems:  Review of systems negative except as noted in HPI / PMHx or noted below: Review of Systems  Constitutional: Negative.   HENT: Negative.   Eyes: Negative.   Respiratory: Negative.   Cardiovascular: Negative.   Gastrointestinal: Negative.   Genitourinary: Negative.   Musculoskeletal: Negative.   Skin: Negative.   Neurological: Negative.   Endo/Heme/Allergies: Negative.   Psychiatric/Behavioral: Negative.     Past medical history:  Past Medical History:  Diagnosis Date  .  Asthma   . Bell's palsy   . Hypertension   . Migraine     Past surgical history:  Past Surgical History:  Procedure Laterality Date  . HERNIA REPAIR      Family history: Family History  Problem Relation Age of Onset  . Asthma Son   . Allergic rhinitis Son   . Urticaria Son     Social history: Social History   Socioeconomic History  . Marital status: Single    Spouse name: Not on file  . Number of children: Not on file  . Years of education: Not on file  . Highest education level: Not on file  Occupational History  . Not on file  Tobacco Use  . Smoking status: Current Every Day Smoker    Packs/day: 0.50    Types: Cigarettes  . Smokeless tobacco: Never Used  Substance and Sexual Activity  . Alcohol use: Yes    Comment: daily  . Drug use: Not Currently    Types: Marijuana  . Sexual activity: Not on file  Other Topics Concern  . Not on file  Social History Narrative  . Not on file   Social Determinants of Health   Financial Resource Strain:   . Difficulty of Paying Living Expenses:   Food Insecurity:   . Worried About Programme researcher, broadcasting/film/video in the Last Year:   . Barista in the Last Year:   Transportation Needs:   . Freight forwarder (Medical):   Marland Kitchen Lack of Transportation (Non-Medical):   Physical Activity:   . Days of Exercise per Week:   . Minutes of Exercise per Session:   Stress:   . Feeling of Stress :   Social Connections:   . Frequency of Communication with Friends and Family:   . Frequency of Social Gatherings with Friends and Family:   . Attends Religious Services:   . Active Member of Clubs or Organizations:   . Attends Banker Meetings:   Marland Kitchen Marital Status:   Intimate Partner Violence:   . Fear of Current or Ex-Partner:   . Emotionally Abused:   Marland Kitchen Physically Abused:   . Sexually Abused:     Environmental History: The patient lives in a house with carpeting throughout, gassy, and central air.  There are no pets in  the home.  There is no known mold/water damage in the home.  She smokes half pack of cigarettes per day.  Current Outpatient Medications  Medication Sig Dispense Refill  . albuterol (ACCUNEB) 1.25 MG/3ML nebulizer solution Inhale 3 mLs into the lungs every 4 (four) hours as needed for wheezing.    . ALBUTEROL IN Inhale into the lungs.    . cyclobenzaprine (  FLEXERIL) 10 MG tablet Take 10 mg by mouth 2 (two) times daily as needed for muscle spasms.    . Fluticasone Propionate, Inhal, (FLOVENT IN) Inhale 1 puff into the lungs 2 (two) times daily.     Marland Kitchen gabapentin (NEURONTIN) 600 MG tablet Take 600 mg by mouth 3 (three) times daily.     Marland Kitchen HYDRALAZINE-HCTZ PO Take by mouth.    Marland Kitchen ipratropium-albuterol (DUONEB) 0.5-2.5 (3) MG/3ML SOLN Use 1 vial via nebulizer 4 times daily and as needed for 5 days  1  . losartan (COZAAR) 50 MG tablet Take by mouth.    Marland Kitchen PROAIR HFA 108 (90 Base) MCG/ACT inhaler Inhale 2 puffs into the lungs every 4 (four) hours as needed for wheezing or shortness of breath. 18 g 1  . azelastine (ASTELIN) 0.1 % nasal spray One to two sprays each nostril twice a day as needed. 30 mL 5  . EPINEPHrine 0.3 mg/0.3 mL IJ SOAJ injection Use as directed for severe allergic reaction. 2 each 3  . levocetirizine (XYZAL) 5 MG tablet Take 1 tablet (5 mg total) by mouth daily as needed for allergies. 30 tablet 5  . mometasone-formoterol (DULERA) 200-5 MCG/ACT AERO Two puffs with spacer twice a day 13 g 5  . montelukast (SINGULAIR) 10 MG tablet Take 1 tablet (10 mg total) by mouth at bedtime. 30 tablet 5  . Olopatadine HCl (PATADAY) 0.2 % SOLN One drop each eye once a day as needed for itchy eyes. 2.5 mL 5  . Tiotropium Bromide Monohydrate (SPIRIVA RESPIMAT) 1.25 MCG/ACT AERS Inhale 2 puffs into the lungs daily. 4 g 5   No current facility-administered medications for this visit.    Known medication allergies: Allergies  Allergen Reactions  . Ketorolac Anaphylaxis  . Lisinopril Swelling  .  Tramadol Anaphylaxis  . Propranolol     I appreciate the opportunity to take part in Rey's care. Please do not hesitate to contact me with questions.  Sincerely,   R. Jorene Guest, MD

## 2020-02-23 NOTE — Telephone Encounter (Signed)
Okay, call in Stiolto Respimat, 2 inhalations once daily.  However, we will have to switch her from [facility] to [employer].  Therefore, please call in Flovent 220 mcg, 2 inhalations via spacer device twice daily.  Stiolto Respimat, 2 inhalations once daily Flovent 220 mcg, 2 inhalations via spacer device twice daily Discontinue Dulera.  Thanks.

## 2020-02-23 NOTE — Assessment & Plan Note (Signed)
   Aeroallergen avoidance measures have been discussed and provided in written form.  A prescription has been provided for levocetirizine(Xyzal), 5 mg daily as needed.  A prescription has been provided for azelastine nasal spray, 1-2 sprays per nostril 2 times daily as needed. Proper nasal spray technique has been discussed and demonstrated.   Nasal saline spray (i.e., Simply Saline) or nasal saline lavage (i.e., NeilMed) is recommended as needed and prior to medicated nasal sprays.  The risks and benefits of aeroallergen immunotherapy have been discussed. The patient is motivated to initiate immunotherapy if insurance coverage is favorable. She will let us know how she would like to proceed.

## 2020-02-24 ENCOUNTER — Encounter: Payer: Self-pay | Admitting: Allergy and Immunology

## 2020-02-24 ENCOUNTER — Other Ambulatory Visit: Payer: Self-pay

## 2020-02-24 DIAGNOSIS — R030 Elevated blood-pressure reading, without diagnosis of hypertension: Secondary | ICD-10-CM | POA: Insufficient documentation

## 2020-02-24 MED ORDER — STIOLTO RESPIMAT 2.5-2.5 MCG/ACT IN AERS: 2.0000 | INHALATION_SPRAY | Freq: Every day | RESPIRATORY_TRACT | 5 refills | Status: DC

## 2020-02-24 MED ORDER — FLOVENT HFA 220 MCG/ACT IN AERO: 2.0000 | INHALATION_SPRAY | Freq: Two times a day (BID) | RESPIRATORY_TRACT | 5 refills | Status: AC

## 2020-02-24 NOTE — Assessment & Plan Note (Signed)
   The patient has been made aware of the elevated blood pressure reading and has been encouraged to follow up with her primary care physician in the near future regarding this issue. 

## 2020-02-24 NOTE — Telephone Encounter (Signed)
Informed pt of change of inhalers from dulera to flovent and that we was also sending in the stiolto as insurance did not cover the sprivia. Pt stated understanding

## 2020-02-27 DIAGNOSIS — J301 Allergic rhinitis due to pollen: Secondary | ICD-10-CM

## 2020-02-27 NOTE — Progress Notes (Addendum)
VIALS EXP 02-26-21.  ADDITIONAL LABELS NEEDED.

## 2020-02-28 DIAGNOSIS — J3081 Allergic rhinitis due to animal (cat) (dog) hair and dander: Secondary | ICD-10-CM

## 2020-02-29 DIAGNOSIS — J3089 Other allergic rhinitis: Secondary | ICD-10-CM

## 2020-03-15 ENCOUNTER — Ambulatory Visit: Payer: Medicaid Other

## 2020-04-01 LAB — ALLERGEN SESAME F10: Sesame Seed IgE: 7.1 kU/L — AB

## 2020-04-01 LAB — CBC WITH DIFFERENTIAL/PLATELET
Basophils Absolute: 0 10*3/uL (ref 0.0–0.2)
Basos: 1 %
EOS (ABSOLUTE): 0.1 10*3/uL (ref 0.0–0.4)
Eos: 3 %
Hematocrit: 40.2 % (ref 34.0–46.6)
Hemoglobin: 13.8 g/dL (ref 11.1–15.9)
Immature Grans (Abs): 0 10*3/uL (ref 0.0–0.1)
Immature Granulocytes: 0 %
Lymphocytes Absolute: 1.2 10*3/uL (ref 0.7–3.1)
Lymphs: 32 %
MCH: 31.9 pg (ref 26.6–33.0)
MCHC: 34.3 g/dL (ref 31.5–35.7)
MCV: 93 fL (ref 79–97)
Monocytes Absolute: 0.3 10*3/uL (ref 0.1–0.9)
Monocytes: 8 %
Neutrophils Absolute: 2.1 10*3/uL (ref 1.4–7.0)
Neutrophils: 56 %
Platelets: 297 10*3/uL (ref 150–450)
RBC: 4.33 x10E6/uL (ref 3.77–5.28)
RDW: 12.4 % (ref 11.7–15.4)
WBC: 3.7 10*3/uL (ref 3.4–10.8)

## 2020-04-01 LAB — ALLERGEN PROFILE, SHELLFISH
Clam IgE: 0.32 kU/L — AB
F023-IgE Crab: 0.58 kU/L — AB
F080-IgE Lobster: 0.35 kU/L — AB
F290-IgE Oyster: 0.49 kU/L — AB
Scallop IgE: 0.76 kU/L — AB
Shrimp IgE: 1.35 kU/L — AB

## 2020-04-01 LAB — IGE NUT PROF. W/COMPONENT RFLX
F017-IgE Hazelnut (Filbert): 2.48 kU/L — AB
F018-IgE Brazil Nut: 0.33 kU/L — AB
F020-IgE Almond: 0.61 kU/L — AB
F202-IgE Cashew Nut: 0.41 kU/L — AB
F203-IgE Pistachio Nut: 6.29 kU/L — AB
F256-IgE Walnut: 3.92 kU/L — AB
Macadamia Nut, IgE: 2.45 kU/L — AB
Peanut, IgE: 6.22 kU/L — AB
Pecan Nut IgE: 0.14 kU/L — AB

## 2020-04-01 LAB — PANEL 604726
Cor A 1 IgE: <0.1 kU/L
Cor A 14 IgE: <0.1 kU/L
Cor A 8 IgE: <0.1 kU/L
Cor A 9 IgE: 0.28 kU/L — AB

## 2020-04-01 LAB — PEANUT COMPONENTS
F352-IgE Ara h 8: <0.1 kU/L
F422-IgE Ara h 1: <0.1 kU/L
F423-IgE Ara h 2: <0.1 kU/L
F424-IgE Ara h 3: <0.1 kU/L
F427-IgE Ara h 9: <0.1 kU/L
F447-IgE Ara h 6: <0.1 kU/L

## 2020-04-01 LAB — PANEL 604239: ANA O 3 IgE: <0.1 kU/L

## 2020-04-01 LAB — IGE: IgE (Immunoglobulin E), Serum: 1445 IU/mL — ABNORMAL HIGH (ref 6–495)

## 2020-04-01 LAB — ALLERGEN COMPONENT COMMENTS

## 2020-04-01 LAB — ALLERGEN SUNFLOWER SEED K84: Sunflower Seed k84: 4.69 kU/L — AB

## 2020-04-01 LAB — PANEL 604350: Ber E 1 IgE: <0.1 kU/L

## 2020-04-01 LAB — PANEL 604721
Jug R 1 IgE: <0.1 kU/L
Jug R 3 IgE: 0.78 kU/L — AB

## 2020-04-05 ENCOUNTER — Ambulatory Visit: Payer: Medicaid Other | Admitting: Pediatrics

## 2020-04-05 NOTE — Progress Notes (Deleted)
Follow Up Note  RE: Deanna Owens MRN: [id number] DOB: 1986-05-25 Date of Office Visit: 04/06/2020  Referring provider: List, Nash Shearer, FNP Primary care provider: List, Nash Shearer, FNP  Chief Complaint: No chief complaint on file.  History of Present Illness: I had the pleasure of seeing Deanna Owens for a follow up visit at the [facility] on 04/05/2020. She is a 34 y.o. female, who is being followed for food allergies, asthma, allergic rhinoconjunctivitis. Her previous allergy office visit was on 02/23/2020 with Dr. Nunzio Cobbs. Today is a regular follow up visit.  Okay, call in Stiolto Respimat, 2 inhalations once daily.  However, we will have to switch her from [facility] to [facility] [facility].  Therefore, please call in Flovent 220 mcg, 2 inhalations via spacer device twice daily.  Stiolto Respimat, 2 inhalations once daily Flovent 220 mcg, 2 inhalations via spacer device twice daily Discontinue Dulera.   Food allergy The patient's history suggests food allergy and positive skin test results today confirm this diagnosis.  Food allergen skin testing revealed reactivity to peanut, sesame seed, walnut, almond, hazelnut, [place] nut, and pistachio.  Meticulous avoidance of peanuts, tree nuts, sesame seed, and sunflower seed as discussed.  As lab work is being done for other purposes, we will check serum specific IgE levels for peanut with reflex components, tree nut panel with reflex components, sesame seed, sunflower seed, and shellfish panel.  A prescription has been provided for epinephrine auto-injector 2 pack along with instructions for proper administration.  A food allergy action plan has been provided and discussed.  Medic Alert identification is recommended.  Severe persistent asthma Currently with suboptimal control.  The patient's insurance does not cover Spiriva Respimat.  A prescription has been provided for Stiolto Respimat, 2 inhalations once daily.  A  prescription has been provided for Flovent 220 mcg, 2 inhalations twice daily.  To maximize pulmonary deposition, a spacer has been provided along with instructions for its proper administration with an HFA inhaler.  Discontinue Dulera.  To maximize pulmonary deposition, a spacer has been provided along with instructions for its proper administration with an HFA inhaler.  A prescription has been provided for Spiriva Respimat 1.25 g, 2 inhalations daily.  Continue montelukast 10 mg daily and albuterol HFA, 1 to 2 inhalations every 4-6 hours as needed.  A laboratory order form has been provided for CBC with differential and serum IgE level.  The patient has been asked to contact me if her symptoms persist or progress. Otherwise, she may return for follow up in 6 weeks.  Seasonal and perennial allergic rhinitis  Aeroallergen avoidance measures have been discussed and provided in written form.  A prescription has been provided for levocetirizine(Xyzal), 5 mg daily as needed.  A prescription has been provided for azelastine nasal spray, 1-2 sprays per nostril 2 times daily as needed. Proper nasal spray technique has been discussed and demonstrated.   Nasal saline spray (i.e., Simply Saline) or nasal saline lavage (i.e., NeilMed) is recommended as needed and prior to medicated nasal sprays.  The risks and benefits of aeroallergen immunotherapy have been discussed. The patient is motivated to initiate immunotherapy if insurance coverage is favorable. She will let us know how she would like to proceed.  Allergic conjunctivitis  Treatment plan as outlined above for allergic rhinitis.  A prescription has been provided for Pataday, one drop per eye daily as needed.  I have also recommended eye lubricant drops (i.e., Natural Tears) as needed.  Elevated blood pressure reading  The patient has been made aware of the elevated blood pressure reading and has been encouraged to follow up with  her primary care physician in the near future regarding this issue.  Assessment and Plan: Deanna Owens is a 34 y.o. female with: No problem-specific Assessment & Plan notes found for this encounter.  No follow-ups on file.  No orders of the defined types were placed in this encounter.  Lab Orders  No laboratory test(s) ordered today    Diagnostics: Spirometry:  Tracings reviewed. Her effort: {Blank single:19197::"Good reproducible efforts.","It was hard to get consistent efforts and there is a question as to whether this reflects a maximal maneuver.","Poor effort, data can not be interpreted."} FVC: ***L FEV1: ***L, ***% predicted FEV1/FVC ratio: ***% Interpretation: {Blank single:19197::"Spirometry consistent with mild obstructive disease","Spirometry consistent with moderate obstructive disease","Spirometry consistent with severe obstructive disease","Spirometry consistent with possible restrictive disease","Spirometry consistent with mixed obstructive and restrictive disease","Spirometry uninterpretable due to technique","Spirometry consistent with normal pattern","No overt abnormalities noted given today's efforts"}.  Please see scanned spirometry results for details.  Skin Testing: {Blank single:19197::"Select foods","Environmental allergy panel","Environmental allergy panel and select foods","Food allergy panel","None","Deferred due to recent antihistamines use"}. Positive test to: ***. Negative test to: ***.  Results discussed with patient/family.   Medication List:  Current Outpatient Medications  Medication Sig Dispense Refill  . albuterol (ACCUNEB) 1.25 MG/3ML nebulizer solution Inhale 3 mLs into the lungs every 4 (four) hours as needed for wheezing.    . ALBUTEROL IN Inhale into the lungs.    Marland Kitchen azelastine (ASTELIN) 0.1 % nasal spray One to two sprays each nostril twice a day as needed. 30 mL 5  . cyclobenzaprine (FLEXERIL) 10 MG tablet Take 10 mg by mouth 2 (two) times daily as  needed for muscle spasms.    Marland Kitchen EPINEPHrine 0.3 mg/0.3 mL IJ SOAJ injection Use as directed for severe allergic reaction. 2 each 3  . fluticasone (FLOVENT HFA) 220 MCG/ACT inhaler Inhale 2 puffs into the lungs 2 (two) times daily. 1 Inhaler 5  . Fluticasone Propionate, Inhal, (FLOVENT IN) Inhale 1 puff into the lungs 2 (two) times daily.     Marland Kitchen gabapentin (NEURONTIN) 600 MG tablet Take 600 mg by mouth 3 (three) times daily.     Marland Kitchen HYDRALAZINE-HCTZ PO Take by mouth.    Marland Kitchen ipratropium-albuterol (DUONEB) 0.5-2.5 (3) MG/3ML SOLN Use 1 vial via nebulizer 4 times daily and as needed for 5 days  1  . levocetirizine (XYZAL) 5 MG tablet Take 1 tablet (5 mg total) by mouth daily as needed for allergies. 30 tablet 5  . losartan (COZAAR) 50 MG tablet Take by mouth.    . mometasone-formoterol (DULERA) 200-5 MCG/ACT AERO Two puffs with spacer twice a day 13 g 5  . montelukast (SINGULAIR) 10 MG tablet Take 1 tablet (10 mg total) by mouth at bedtime. 30 tablet 5  . Olopatadine HCl (PATADAY) 0.2 % SOLN One drop each eye once a day as needed for itchy eyes. 2.5 mL 5  . PROAIR HFA 108 (90 Base) MCG/ACT inhaler Inhale 2 puffs into the lungs every 4 (four) hours as needed for wheezing or shortness of breath. 18 g 1  . Tiotropium Bromide Monohydrate (SPIRIVA RESPIMAT) 1.25 MCG/ACT AERS Inhale 2 puffs into the lungs daily. 4 g 5  . Tiotropium Bromide-Olodaterol (STIOLTO RESPIMAT) 2.5-2.5 MCG/ACT AERS Inhale 2 puffs into the lungs daily. 4 g 5   No current facility-administered medications for this visit.   Allergies: Allergies  Allergen Reactions  . Ketorolac  Anaphylaxis  . Lisinopril Swelling  . Tramadol Anaphylaxis  . Propranolol    I reviewed her past medical history, social history, family history, and environmental history and no significant changes have been reported from her previous visit.  Review of Systems  Constitutional: Negative for appetite change, chills, fever and unexpected weight change.  HENT:  Negative for congestion and rhinorrhea.   Eyes: Negative for itching.  Respiratory: Negative for cough, chest tightness, shortness of breath and wheezing.   Gastrointestinal: Negative for abdominal pain.  Skin: Negative for rash.  Allergic/Immunologic: Positive for environmental allergies and food allergies.  Neurological: Negative for headaches.   Objective: There were no vitals taken for this visit. There is no height or weight on file to calculate BMI. Physical Exam  Constitutional: She is oriented to person, place, and time. She appears well-developed and well-nourished.  HENT:  Head: Normocephalic and atraumatic.  Right Ear: External ear normal.  Left Ear: External ear normal.  Nose: Nose normal.  Mouth/Throat: Oropharynx is clear and moist.  Eyes: Conjunctivae and EOM are normal.  Cardiovascular: Normal rate, regular rhythm and normal heart sounds. Exam reveals no gallop and no friction rub.  No murmur heard. Pulmonary/Chest: Effort normal and breath sounds normal. She has no wheezes. She has no rales.  Musculoskeletal:     Cervical back: Neck supple.  Neurological: She is alert and oriented to person, place, and time.  Skin: Skin is warm. No rash noted.  Psychiatric: She has a normal mood and affect. Her behavior is normal.  Nursing note and vitals reviewed.  Previous notes and tests were reviewed. The plan was reviewed with the patient/family, and all questions/concerned were addressed.  It was my pleasure to see Deanna Owens today and participate in her care. Please feel free to contact me with any questions or concerns.  Sincerely,  Wyline Mood, DO Allergy & Immunology  [facility] office: [phone] [facility] office: [phone] [place] office: [phone]

## 2020-04-06 ENCOUNTER — Ambulatory Visit: Payer: Medicaid Other | Admitting: Allergy

## 2020-04-06 DIAGNOSIS — T7800XA Anaphylactic reaction due to unspecified food, initial encounter: Secondary | ICD-10-CM

## 2020-04-06 DIAGNOSIS — J4550 Severe persistent asthma, uncomplicated: Secondary | ICD-10-CM

## 2020-04-06 DIAGNOSIS — H1010 Acute atopic conjunctivitis, unspecified eye: Secondary | ICD-10-CM

## 2020-04-11 ENCOUNTER — Telehealth: Payer: Self-pay | Admitting: Allergy and Immunology

## 2020-04-11 NOTE — Telephone Encounter (Signed)
Patient would like to talk with the nurse again about her labs. Has some questions. [phone]

## 2020-04-11 NOTE — Telephone Encounter (Signed)
Called patient back and reviewed labs in regards and talked about her shellfish allergy and advised patient to avoid all shellfish. Patient verbalized understanding.

## 2020-04-12 ENCOUNTER — Emergency Department (HOSPITAL_BASED_OUTPATIENT_CLINIC_OR_DEPARTMENT_OTHER)
Admission: EM | Admit: 2020-04-12 | Discharge: 2020-04-12 | Disposition: A | Discharge status: Home / Self Care | Payer: Medicaid Other | Attending: Emergency Medicine | Admitting: Emergency Medicine

## 2020-04-12 ENCOUNTER — Other Ambulatory Visit: Payer: Self-pay

## 2020-04-12 ENCOUNTER — Encounter (HOSPITAL_BASED_OUTPATIENT_CLINIC_OR_DEPARTMENT_OTHER): Payer: Self-pay | Admitting: *Deleted

## 2020-04-12 DIAGNOSIS — J45909 Unspecified asthma, uncomplicated: Secondary | ICD-10-CM | POA: Insufficient documentation

## 2020-04-12 DIAGNOSIS — Z91013 Allergy to seafood: Secondary | ICD-10-CM | POA: Diagnosis not present

## 2020-04-12 DIAGNOSIS — Z91010 Allergy to peanuts: Secondary | ICD-10-CM | POA: Diagnosis not present

## 2020-04-12 DIAGNOSIS — Z8616 Personal history of COVID-19: Secondary | ICD-10-CM | POA: Insufficient documentation

## 2020-04-12 DIAGNOSIS — F17210 Nicotine dependence, cigarettes, uncomplicated: Secondary | ICD-10-CM | POA: Insufficient documentation

## 2020-04-12 DIAGNOSIS — T7840XA Allergy, unspecified, initial encounter: Secondary | ICD-10-CM | POA: Diagnosis present

## 2020-04-12 DIAGNOSIS — R509 Fever, unspecified: Secondary | ICD-10-CM | POA: Diagnosis not present

## 2020-04-12 DIAGNOSIS — R070 Pain in throat: Secondary | ICD-10-CM

## 2020-04-12 DIAGNOSIS — Z79899 Other long term (current) drug therapy: Secondary | ICD-10-CM | POA: Insufficient documentation

## 2020-04-12 DIAGNOSIS — I10 Essential (primary) hypertension: Secondary | ICD-10-CM | POA: Insufficient documentation

## 2020-04-12 LAB — URINALYSIS, ROUTINE W REFLEX MICROSCOPIC
Bilirubin Urine: NEGATIVE
Glucose, UA: NEGATIVE mg/dL
Hgb urine dipstick: NEGATIVE
Ketones, ur: NEGATIVE mg/dL
Nitrite: NEGATIVE
Protein, ur: NEGATIVE mg/dL
Specific Gravity, Urine: 1.015 (ref 1.005–1.030)
pH: 8 (ref 5.0–8.0)

## 2020-04-12 LAB — URINALYSIS, MICROSCOPIC (REFLEX)

## 2020-04-12 LAB — PREGNANCY, URINE: Preg Test, Ur: NEGATIVE

## 2020-04-12 MED ORDER — PREDNISONE 10 MG PO TABS
30.0000 mg | ORAL_TABLET | Freq: Every day | ORAL | 0 refills | Status: AC
Start: 2020-04-12 — End: 2020-04-15

## 2020-04-12 MED ORDER — PREDNISONE 50 MG PO TABS
60.0000 mg | ORAL_TABLET | Freq: Once | ORAL | Status: AC
  Administered 2020-04-12: 60 mg via ORAL
  Filled 2020-04-12: qty 1

## 2020-04-12 NOTE — ED Triage Notes (Signed)
She was eating cheese and bean dip today and had onset of itching in her throat. She took Benadryl prior to eating in case she might have a reaction. She went to [facility] and left due to long wait. States she is anxious. No gives or difficulty breathing. States when she presses on her throat is when she feel itching in her throat.

## 2020-04-12 NOTE — ED Provider Notes (Signed)
[facility] EMERGENCY DEPARTMENT Provider Note   CSN: [id number] Arrival date & time: 04/12/20  2002     History Chief Complaint  Patient presents with  . Allergic Reaction    Deanna Owens is a 34 y.o. female.  HPI      Cheese bean drip yesterday, make on tortillas not sure if that's what it is Peanut and shellfish allergy discovered on allergy testing, has epi pen but has not every had to use it or receive epinephrine   Sometimes gets anxiety to the allergies 530PM, 30 minutes later began to have itching in throat Had oral swelling before, saw allergist and found out had these allergeis Peanut allergy blood work and shellfish  No shortness of breath now, at the beginning it was Throat feels itchy Doesn't feel tight Tongue felt a little swollen on right side, sometimes will come on and go away  Feels like swallowing is off, throat is itchy Voice not different  No nausea/vomiting/diarrhea No rash  Not aware of fever. No symptoms other than throat itchiness. No sore throat, no congestion, no cough. No urinary symptoms. No shortness of breath or chest pain. No n/v/d/headache. Had COVID 1 month ago and also had the first des of COVID vaccine.  Past Medical History:  Diagnosis Date  . Asthma   . Bell's palsy   . Hypertension   . Migraine     Patient Active Problem List   Diagnosis Date Noted  . Elevated blood pressure reading 02/24/2020  . Food allergy 02/23/2020  . Seasonal and perennial allergic rhinitis 02/23/2020  . Allergic conjunctivitis 02/23/2020  . Severe persistent asthma 08/11/2018  . Asthma attack 08/11/2018  . Polysubstance abuse (HCC) 04/25/2017  . Hypertensive urgency 04/25/2017  . Hypokalemia 04/25/2017  . Left-sided weakness 04/24/2017  . Left sided numbness 04/24/2017    Past Surgical History:  Procedure Laterality Date  . HERNIA REPAIR       OB History   No obstetric history on file.     Family History  Problem  Relation Age of Onset  . Asthma Son   . Allergic rhinitis Son   . Urticaria Son     Social History   Tobacco Use  . Smoking status: Current Every Day Smoker    Packs/day: 0.50    Types: Cigarettes  . Smokeless tobacco: Never Used  Substance Use Topics  . Alcohol use: Yes    Comment: daily  . Drug use: Not Currently    Types: Marijuana    Home Medications Prior to Admission medications   Medication Sig Start Date End Date Taking? Authorizing Provider  albuterol (ACCUNEB) 1.25 MG/3ML nebulizer solution Inhale 3 mLs into the lungs every 4 (four) hours as needed for wheezing. 10/15/15   [provider]  ALBUTEROL IN Inhale into the lungs.    [provider]  azelastine (ASTELIN) 0.1 % nasal spray One to two sprays each nostril twice a day as needed. 02/23/20   Bobbitt, Heywood Iles, MD  cyclobenzaprine (FLEXERIL) 10 MG tablet Take 10 mg by mouth 2 (two) times daily as needed for muscle spasms.    [provider]  EPINEPHrine 0.3 mg/0.3 mL IJ SOAJ injection Use as directed for severe allergic reaction. 02/23/20   Bobbitt, Heywood Iles, MD  fluticasone (FLOVENT HFA) 220 MCG/ACT inhaler Inhale 2 puffs into the lungs 2 (two) times daily. 02/24/20   Bobbitt, Heywood Iles, MD  Fluticasone Propionate, Inhal, (FLOVENT IN) Inhale 1 puff into the lungs 2 (two)  times daily.     [provider]  gabapentin (NEURONTIN) 600 MG tablet Take 600 mg by mouth 3 (three) times daily.     [provider]  HYDRALAZINE-HCTZ PO Take by mouth.    [provider]  ipratropium-albuterol (DUONEB) 0.5-2.5 (3) MG/3ML SOLN Use 1 vial via nebulizer 4 times daily and as needed for 5 days 06/11/18   [provider]  levocetirizine (XYZAL) 5 MG tablet Take 1 tablet (5 mg total) by mouth daily as needed for allergies. 02/23/20   Bobbitt, Sedalia Muta, MD  losartan (COZAAR) 50 MG tablet Take by mouth.    [provider]  mometasone-formoterol [facility]) 200-5  MCG/ACT AERO Two puffs with spacer twice a day 02/23/20   Bobbitt, Sedalia Muta, MD  montelukast (SINGULAIR) 10 MG tablet Take 1 tablet (10 mg total) by mouth at bedtime. 02/23/20   Bobbitt, Sedalia Muta, MD  Olopatadine HCl (PATADAY) 0.2 % SOLN One drop each eye once a day as needed for itchy eyes. 02/23/20   Bobbitt, Sedalia Muta, MD  predniSONE (DELTASONE) 10 MG tablet Take 3 tablets (30 mg total) by mouth daily for 3 days. 04/12/20 04/15/20  Gareth Morgan, MD  PROAIR HFA 108 [phone] Base) MCG/ACT inhaler Inhale 2 puffs into the lungs every 4 (four) hours as needed for wheezing or shortness of breath. 02/23/20   Bobbitt, Sedalia Muta, MD  Tiotropium Bromide Monohydrate (SPIRIVA RESPIMAT) 1.25 MCG/ACT AERS Inhale 2 puffs into the lungs daily. 02/23/20   Bobbitt, Sedalia Muta, MD  Tiotropium Bromide-Olodaterol (STIOLTO RESPIMAT) 2.5-2.5 MCG/ACT AERS Inhale 2 puffs into the lungs daily. 02/24/20   Bobbitt, Sedalia Muta, MD    Allergies    Ketorolac, Lisinopril, Tramadol, and Propranolol  Review of Systems   Review of Systems  Constitutional: Negative for appetite change, fatigue and fever.  HENT: Negative for congestion, sore throat, trouble swallowing (feels off) and voice change.   Eyes: Negative for visual disturbance.  Respiratory: Negative for cough and shortness of breath.   Cardiovascular: Negative for chest pain.  Gastrointestinal: Negative for abdominal pain, diarrhea, nausea and vomiting.  Genitourinary: Negative for difficulty urinating and dysuria.  Musculoskeletal: Negative for arthralgias, back pain, myalgias and neck pain.  Skin: Negative for rash.  Neurological: Positive for headaches. Negative for syncope.    Physical Exam Updated Vital Signs BP (!) 170/105   Pulse (!) 115   Temp (!) 100.7 F (38.2 C) (Oral)   Resp 16   Ht 5\' 6"  (1.676 m)   Wt 68.3 kg   SpO2 99%   BMI 24.30 kg/m   Physical Exam Vitals and nursing note reviewed.  Constitutional:      General: She is  not in acute distress.    Appearance: She is well-developed. She is not diaphoretic.  HENT:     Head: Normocephalic and atraumatic.  Eyes:     Conjunctiva/sclera: Conjunctivae normal.  Cardiovascular:     Rate and Rhythm: Normal rate and regular rhythm.     Heart sounds: Normal heart sounds. No murmur. No friction rub. No gallop.   Pulmonary:     Effort: Pulmonary effort is normal. No respiratory distress.     Breath sounds: Normal breath sounds. No wheezing or rales.  Abdominal:     General: There is no distension.     Palpations: Abdomen is soft.     Tenderness: There is no abdominal tenderness. There is no guarding.  Musculoskeletal:        General: No tenderness.  Cervical back: Normal range of motion.  Skin:    General: Skin is warm and dry.     Findings: No erythema or rash.  Neurological:     Mental Status: She is alert and oriented to person, place, and time.     ED Results / Procedures / Treatments   Labs (all labs ordered are listed, but only abnormal results are displayed) Labs Reviewed  URINALYSIS, ROUTINE W REFLEX MICROSCOPIC - Abnormal; Notable for the following components:      Result Value   APPearance HAZY (*)    Leukocytes,Ua MODERATE (*)    All other components within normal limits  URINALYSIS, MICROSCOPIC (REFLEX) - Abnormal; Notable for the following components:   Bacteria, UA MANY (*)    All other components within normal limits  PREGNANCY, URINE    EKG None  Radiology No results found.  Procedures Procedures (including critical care time)  Medications Ordered in ED Medications  predniSONE (DELTASONE) tablet 60 mg (60 mg Oral Given 04/12/20 2200)    ED Course  I have reviewed the triage vital signs and the nursing notes.  Pertinent labs & imaging results that were available during my care of the patient were reviewed by me and considered in my medical decision making (see chart for details).    MDM Rules/Calculators/A&P                        34yo female with history above presents with concern for possible allergic reaction with throat itchiness.  Do not detect oropharyngeal swelling on exam, no drooling, no dysphonia, no stridor and no other symptoms including no dyspnea, vomiting, nor rash 4 hours after ingestion and have low suspicion for anaphylaxis at this time and do not feel epinephrine indicated.  Given sensation of throat itchiness, however, do feel steroids, benadryl reasonable for possible mild allergic phenomena and provided rx for prednisone.  She also incidentally has a fever today without other symptoms. Not having sore throat, has good ROM of neck, no sign of PTA, RPA or epiglottitis.  Had COVID one month ago, not having symptoms to suggest thromboembolism.   UA appears contaminated with many bacteria however 0-5WBC and 21-50 squamous cells.  Will send for culture but hold on treatment given no urinary symptoms and appears more consistent with contamination than infection.  May be other viral syndrome, recommend continued monitoring of symptoms and discussed reasons to return.   Final Clinical Impression(s) / ED Diagnoses Final diagnoses:  Fever, unspecified fever cause  Throat discomfort    Rx / DC Orders ED Discharge Orders         Ordered    predniSONE (DELTASONE) 10 MG tablet  Daily     04/12/20 2232           Alvira Monday, MD 04/13/20 1231

## 2020-04-13 ENCOUNTER — Telehealth: Payer: Self-pay | Admitting: Allergy and Immunology

## 2020-04-13 DIAGNOSIS — T7800XA Anaphylactic reaction due to unspecified food, initial encounter: Secondary | ICD-10-CM

## 2020-04-13 NOTE — Telephone Encounter (Signed)
Called and spoke with the patient and she stated that she believes she had an allergic reaction and went to the ED and was evaluated. No emergency medications were administered and patient was sent home with a Prednisone taper. Patient states that she is just really anxious about these food allergies and she is wondering if we can do blood work for a few extra foods to put her mind at ease. She would like to be tested for red crushed pepper, celery, soybean, wheat, and coconut. Please advise. Patient states that when she had the reaction she had drank a smoothie that contained coconut and then had some tortilla chips. Please advise.

## 2020-04-13 NOTE — Telephone Encounter (Signed)
Patient called and would like to talk with someone about what she was tested for . Needs to know if it was soybean and wheat. [phone]

## 2020-04-16 NOTE — Telephone Encounter (Signed)
Called and left a voicemail per DPR permission advising that labs have been placed. Asked for the patient to call back to determine if she would like the lab requisition to be mailed to her or if she would like to come and get the labs drawn in our  office.

## 2020-04-16 NOTE — Telephone Encounter (Signed)
Foods have been ordered for blood work. Called patient and left a voicemail asking for her to return call to discuss.

## 2020-04-16 NOTE — Telephone Encounter (Signed)
Yes, we may order labwork to those foods. Have her avoid those foods and have access to epinephrine autoinjectors until the results come back. Thanks.

## 2020-05-17 ENCOUNTER — Other Ambulatory Visit: Payer: Self-pay | Admitting: Allergy and Immunology

## 2020-05-29 ENCOUNTER — Emergency Department (HOSPITAL_BASED_OUTPATIENT_CLINIC_OR_DEPARTMENT_OTHER)
Admission: EM | Admit: 2020-05-29 | Discharge: 2020-05-29 | Disposition: A | Discharge status: Home / Self Care | Payer: Medicaid Other | Attending: Emergency Medicine | Admitting: Emergency Medicine

## 2020-05-29 ENCOUNTER — Other Ambulatory Visit: Payer: Self-pay

## 2020-05-29 ENCOUNTER — Encounter (HOSPITAL_BASED_OUTPATIENT_CLINIC_OR_DEPARTMENT_OTHER): Payer: Self-pay | Admitting: Emergency Medicine

## 2020-05-29 DIAGNOSIS — I10 Essential (primary) hypertension: Secondary | ICD-10-CM | POA: Insufficient documentation

## 2020-05-29 DIAGNOSIS — F17210 Nicotine dependence, cigarettes, uncomplicated: Secondary | ICD-10-CM | POA: Insufficient documentation

## 2020-05-29 DIAGNOSIS — K0889 Other specified disorders of teeth and supporting structures: Secondary | ICD-10-CM | POA: Diagnosis present

## 2020-05-29 DIAGNOSIS — J45909 Unspecified asthma, uncomplicated: Secondary | ICD-10-CM | POA: Diagnosis not present

## 2020-05-29 DIAGNOSIS — Z79899 Other long term (current) drug therapy: Secondary | ICD-10-CM | POA: Diagnosis not present

## 2020-05-29 MED ORDER — HYDROCODONE-ACETAMINOPHEN 5-325 MG PO TABS: 1.0000 | ORAL_TABLET | ORAL | 0 refills | Status: DC | PRN

## 2020-05-29 MED FILL — HYDROCODON-APAP 5-325: 5-325 | 2 days supply | Qty: 10 | Fill #0

## 2020-05-29 NOTE — ED Provider Notes (Signed)
Deanna Owens   CSN: [id number] Arrival date & time: 05/29/20  1013     History Chief Complaint  Patient presents with  . Dental Pain    Deanna Owens is a 34 y.o. female who presents with dental pain. She states she has an impacted right lower wisdom tooth that has been hurting for the past several days. She had a visit with a dentist who said she needed to see oral surgery because it was growing sideways. She made an appointment with [facility] in [place] but the appointment isn't until July 8. She is taking Tylenol and Meloxicam for pain without relief. She is also taking Clindamycin. It hurts to talk and she reports mild swelling of the right lower jaw. She is requesting help with pain control.  HPI     Past Medical History:  Diagnosis Date  . Asthma   . Bell's palsy   . Hypertension   . Migraine     Patient Active Problem List   Diagnosis Date Noted  . Elevated blood pressure reading 02/24/2020  . Food allergy 02/23/2020  . Seasonal and perennial allergic rhinitis 02/23/2020  . Allergic conjunctivitis 02/23/2020  . Severe persistent asthma 08/11/2018  . Asthma attack 08/11/2018  . Polysubstance abuse ([place]) 04/25/2017  . Hypertensive urgency 04/25/2017  . Hypokalemia 04/25/2017  . Left-sided weakness 04/24/2017  . Left sided numbness 04/24/2017    Past Surgical History:  Procedure Laterality Date  . HERNIA REPAIR       OB History   No obstetric history on file.     Family History  Problem Relation Age of Onset  . Asthma Son   . Allergic rhinitis Son   . Urticaria Son     Social History   Tobacco Use  . Smoking status: Former Smoker    Packs/day: 0.50    Types: Cigarettes  . Smokeless tobacco: Never Used  Vaping Use  . Vaping Use: Never used  Substance Use Topics  . Alcohol use: Not Currently    Comment: daily  . Drug use: Not Currently    Types: Marijuana    Home Medications Prior to  Admission medications   Medication Sig Start Date End Date Taking? Authorizing Provider  meloxicam (MOBIC) 15 MG tablet Take by mouth. 05/23/20 06/22/20 Yes [provider]  azelastine (ASTELIN) 0.1 % nasal spray One to two sprays each nostril twice a day as needed. 02/23/20   Bobbitt, Sedalia Muta, MD  EPINEPHrine 0.3 mg/0.3 mL IJ SOAJ injection Use as directed for severe allergic reaction. 02/23/20   Bobbitt, Sedalia Muta, MD  fluticasone (FLOVENT HFA) 220 MCG/ACT inhaler Inhale 2 puffs into the lungs 2 (two) times daily. 02/24/20   Bobbitt, Sedalia Muta, MD  gabapentin (NEURONTIN) 600 MG tablet Take 600 mg by mouth 3 (three) times daily.     [provider]  HYDRALAZINE-HCTZ PO Take by mouth.    [provider]  ipratropium-albuterol (DUONEB) 0.5-2.5 (3) MG/3ML SOLN Use 1 vial via nebulizer 4 times daily and as needed for 5 days 06/11/18   [provider]  losartan (COZAAR) 50 MG tablet Take by mouth.    [provider]  medroxyPROGESTERone (DEPO-PROVERA) 150 MG/ML injection Inject 150 mg into the muscle every 3 (three) months. 02/22/20   [provider]  mometasone-formoterol [facility]) 200-5 MCG/ACT AERO Two puffs with spacer twice a day 02/23/20   Bobbitt, Sedalia Muta, MD  montelukast (SINGULAIR) 10 MG tablet Take 1 tablet (  10 mg total) by mouth at bedtime. 02/23/20   Bobbitt, Heywood Iles, MD  PROAIR HFA 108 [phone] Base) MCG/ACT inhaler INHALE 2 PUFFS INTO THE LUNGS EVERY 4 HOURS AS NEEDED FOR WHEEZING OR SHORTNESS OF BREATH 05/17/20   Bobbitt, Heywood Iles, MD    Allergies    Ketorolac, Lisinopril, Tramadol, and Propranolol  Review of Systems   Review of Systems  Constitutional: Negative for fever.  HENT: Positive for dental problem and facial swelling.     Physical Exam Updated Vital Signs BP (!) 124/94 (BP Location: Right Arm)   Pulse 78   Temp 99.5 F (37.5 C) (Oral)   Resp 16   Ht 5\' 4"  (1.626 m)   Wt 68 kg   SpO2 99%   BMI 25.73  kg/m   Physical Exam Vitals and nursing Owens reviewed.  Constitutional:      General: She is not in acute distress.    Appearance: Normal appearance. She is well-developed.  HENT:     Head: Normocephalic and atraumatic.     Mouth/Throat:     Comments: Impacted right lower wisdom tooth Eyes:     General: No scleral icterus.       Right eye: No discharge.        Left eye: No discharge.     Conjunctiva/sclera: Conjunctivae normal.     Pupils: Pupils are equal, round, and reactive to light.  Cardiovascular:     Rate and Rhythm: Normal rate and regular rhythm.  Pulmonary:     Effort: Pulmonary effort is normal. No respiratory distress.     Breath sounds: Normal breath sounds.  Abdominal:     General: There is no distension.     Palpations: Abdomen is soft.     Tenderness: There is no abdominal tenderness.  Musculoskeletal:     Cervical back: Normal range of motion.  Skin:    General: Skin is warm and dry.  Neurological:     Mental Status: She is alert and oriented to person, place, and time.  Psychiatric:        Behavior: Behavior normal.     ED Results / Procedures / Treatments   Labs (all labs ordered are listed, but only abnormal results are displayed) Labs Reviewed - No data to display  EKG None  Radiology No results found.  Procedures Procedures (including critical care time)  Medications Ordered in ED Medications - No data to display  ED Course  I have reviewed the triage vital signs and the nursing notes.  Pertinent labs & imaging results that were available during my care of the patient were reviewed by me and considered in my medical decision making (see chart for details).  34 year old female presents with impacted right lower wisdom tooth which is causing pain for the past week.  She is requesting assistance with pain control today.  Her vital signs are normal.  There is no signs of severe swelling or deep space infection on exam.  Discussed with the  oral surgeon on-call who will try to get the patient in for an appointment sometime this month.  Will prescribe short course of pain medicine advised follow-up with oral surgery  MDM Rules/Calculators/A&P                           Final Clinical Impression(s) / ED Diagnoses Final diagnoses:  Pain, dental    Rx / DC Orders ED Discharge Orders    None  Bethel Born, PA-C 05/29/20 1430    Cathren Laine, MD 05/29/20 [phone]

## 2020-05-29 NOTE — ED Notes (Signed)
ED Provider at bedside. 

## 2020-05-29 NOTE — ED Triage Notes (Signed)
Right lower wisdom tooth pain since 6/11.  Got emergency appt with dentist to have it pulled but dentist said that since it is growing in sideways she will have to go to an oral surgeon.  Is taking abx, Mobic, and tylenol but the pain is worse.  Slight swelling to side of face.

## 2020-05-29 NOTE — Discharge Instructions (Signed)
Continue Meloxicam and Tylenol for mild-moderate pain Continue antibiotics prescribed to you Take Norco for severe pain Please follow up with Dr. Tonye Pearson office

## 2020-05-29 NOTE — ED Notes (Signed)
Pt discharged to home. Discharge instructions have been discussed with patient and/or family members. Pt verbally acknowledges understanding d/c instructions, and endorses comprehension to checkout at registration before leaving.  °

## 2020-06-27 NOTE — Progress Notes (Deleted)
   [address] [place] [address] Dept: [phone]  FOLLOW UP NOTE  Patient ID: Ottilie Wigglesworth, female    DOB: 08-16-1986  Age: 34 y.o. MRN: [id number] Date of Office Visit: 06/28/2020  Assessment  Chief Complaint: No chief complaint on file.  HPI [name]    Drug Allergies:  Allergies  Allergen Reactions  . Ketorolac Anaphylaxis  . Lisinopril Swelling  . Tramadol Anaphylaxis  . Propranolol     Physical Exam: There were no vitals taken for this visit.   Physical Exam  Diagnostics:    Assessment and Plan: No diagnosis found.  No orders of the defined types were placed in this encounter.   There are no Patient Instructions on file for this visit.  No follow-ups on file.    Thank you for the opportunity to care for this patient.  Please do not hesitate to contact me with questions.  Thermon Leyland, FNP [facility]

## 2020-06-28 ENCOUNTER — Ambulatory Visit: Payer: Medicaid Other | Admitting: Family Medicine

## 2020-07-24 ENCOUNTER — Other Ambulatory Visit: Payer: Self-pay | Admitting: Allergy and Immunology

## 2020-08-15 ENCOUNTER — Other Ambulatory Visit: Payer: Self-pay

## 2020-08-15 ENCOUNTER — Encounter: Payer: Medicaid Other | Admitting: Allergy

## 2020-08-15 ENCOUNTER — Encounter: Payer: Self-pay | Admitting: Allergy

## 2020-08-15 VITALS — BP 118/64 | HR 82 | Temp 98.5°F | Resp 20

## 2020-08-15 NOTE — Progress Notes (Deleted)
Follow Up Note  RE: Deanna Owens MRN: [id number] DOB: [birth year] Date of Office Visit: 08/15/2020  Referring provider: List, Nash Shearer, FNP Primary care provider: List, Nash Shearer, FNP  Chief Complaint: No chief complaint on file.  History of Present Illness: I had the pleasure of seeing Deanna Owens for a follow up visit at the [facility] on 08/15/2020. She is a 34 y.o. female, who is being followed for food allergy, asthma, allergic rhinoconjunctivitis. Her previous allergy office visit was on 02/23/2020 with Dr. Nunzio Cobbs. Today is a regular follow up visit.  Food allergy The patient's history suggests food allergy and positive skin test results today confirm this diagnosis.  Food allergen skin testing revealed reactivity to peanut, sesame seed, walnut, almond, hazelnut, [place] nut, and pistachio.  Meticulous avoidance of peanuts, tree nuts, sesame seed, and sunflower seed as discussed.  As lab work is being done for other purposes, we will check serum specific IgE levels for peanut with reflex components, tree nut panel with reflex components, sesame seed, sunflower seed, and shellfish panel.  A prescription has been provided for epinephrine auto-injector 2 pack along with instructions for proper administration.  A food allergy action plan has been provided and discussed.  Medic Alert identification is recommended.  Severe persistent asthma Currently with suboptimal control.  The patient's insurance does not cover Spiriva Respimat.  A prescription has been provided for Stiolto Respimat, 2 inhalations once daily.  A prescription has been provided for Flovent 220 mcg, 2 inhalations twice daily.  To maximize pulmonary deposition, a spacer has been provided along with instructions for its proper administration with an HFA inhaler.  Discontinue Dulera.  To maximize pulmonary deposition, a spacer has been provided along with instructions for its proper  administration with an HFA inhaler.  A prescription has been provided for Spiriva Respimat 1.25 g, 2 inhalations daily.  Continue montelukast 10 mg daily and albuterol HFA, 1 to 2 inhalations every 4-6 hours as needed.  A laboratory order form has been provided for CBC with differential and serum IgE level.  The patient has been asked to contact me if her symptoms persist or progress. Otherwise, she may return for follow up in 6 weeks.  Seasonal and perennial allergic rhinitis  Aeroallergen avoidance measures have been discussed and provided in written form.  A prescription has been provided for levocetirizine(Xyzal), 5 mg daily as needed.  A prescription has been provided for azelastine nasal spray, 1-2 sprays per nostril 2 times daily as needed. Proper nasal spray technique has been discussed and demonstrated.   Nasal saline spray (i.e., Simply Saline) or nasal saline lavage (i.e., NeilMed) is recommended as needed and prior to medicated nasal sprays.  The risks and benefits of aeroallergen immunotherapy have been discussed. The patient is motivated to initiate immunotherapy if insurance coverage is favorable. She will let us know how she would like to proceed.  Allergic conjunctivitis  Treatment plan as outlined above for allergic rhinitis.  A prescription has been provided for Pataday, one drop per eye daily as needed.  I have also recommended eye lubricant drops (i.e., Natural Tears) as needed.  Elevated blood pressure reading  The patient has been made aware of the elevated blood pressure reading and has been encouraged to follow up with her primary care physician in the near future regarding this issue.   Assessment and Plan: Deanna Owens is a 34 y.o. female with: No problem-specific Assessment & Plan notes found for this encounter.  No follow-ups  on file.  No orders of the defined types were placed in this encounter.  Lab Orders  No laboratory test(s) ordered today     Diagnostics: Spirometry:  Tracings reviewed. Her effort: {Blank single:19197::"Good reproducible efforts.","It was hard to get consistent efforts and there is a question as to whether this reflects a maximal maneuver.","Poor effort, data can not be interpreted."} FVC: ***L FEV1: ***L, ***% predicted FEV1/FVC ratio: ***% Interpretation: {Blank single:19197::"Spirometry consistent with mild obstructive disease","Spirometry consistent with moderate obstructive disease","Spirometry consistent with severe obstructive disease","Spirometry consistent with possible restrictive disease","Spirometry consistent with mixed obstructive and restrictive disease","Spirometry uninterpretable due to technique","Spirometry consistent with normal pattern","No overt abnormalities noted given today's efforts"}.  Please see scanned spirometry results for details.  Skin Testing: {Blank single:19197::"Select foods","Environmental allergy panel","Environmental allergy panel and select foods","Food allergy panel","None","Deferred due to recent antihistamines use"}. Positive test to: ***. Negative test to: ***.  Results discussed with patient/family.   Medication List:  Current Outpatient Medications  Medication Sig Dispense Refill  . azelastine (ASTELIN) 0.1 % nasal spray One to two sprays each nostril twice a day as needed. 30 mL 5  . EPINEPHrine 0.3 mg/0.3 mL IJ SOAJ injection Use as directed for severe allergic reaction. 2 each 3  . fluticasone (FLOVENT HFA) 220 MCG/ACT inhaler Inhale 2 puffs into the lungs 2 (two) times daily. 1 Inhaler 5  . gabapentin (NEURONTIN) 600 MG tablet Take 600 mg by mouth 3 (three) times daily.     Marland Kitchen HYDRALAZINE-HCTZ PO Take by mouth.    Marland Kitchen HYDROcodone-acetaminophen (NORCO/VICODIN) 5-325 MG tablet Take 1 tablet by mouth every 4 (four) hours as needed. 10 tablet 0  . ipratropium-albuterol (DUONEB) 0.5-2.5 (3) MG/3ML SOLN Use 1 vial via nebulizer 4 times daily and as needed for 5 days   1  . losartan (COZAAR) 50 MG tablet Take by mouth.    . medroxyPROGESTERone (DEPO-PROVERA) 150 MG/ML injection Inject 150 mg into the muscle every 3 (three) months.    . mometasone-formoterol (DULERA) 200-5 MCG/ACT AERO Two puffs with spacer twice a day 13 g 5  . montelukast (SINGULAIR) 10 MG tablet Take 1 tablet (10 mg total) by mouth at bedtime. 30 tablet 5  . PROAIR HFA 108 (90 Base) MCG/ACT inhaler INHALE 2 PUFFS INTO THE LUNGS EVERY 4 HOURS AS NEEDED FOR WHEEZING OR SHORTNESS OF BREATH 18 g 0   No current facility-administered medications for this visit.   Allergies: Allergies  Allergen Reactions  . Ketorolac Anaphylaxis  . Lisinopril Swelling  . Tramadol Anaphylaxis  . Propranolol    I reviewed her past medical history, social history, family history, and environmental history and no significant changes have been reported from her previous visit.  Review of Systems  Constitutional: Negative for appetite change, chills, fever and unexpected weight change.  HENT: Negative for congestion and rhinorrhea.   Eyes: Negative for itching.  Respiratory: Negative for cough, chest tightness, shortness of breath and wheezing.   Gastrointestinal: Negative for abdominal pain.  Skin: Negative for rash.  Allergic/Immunologic: Positive for environmental allergies and food allergies.  Neurological: Negative for headaches.   Objective: There were no vitals taken for this visit. There is no height or weight on file to calculate BMI. Physical Exam Vitals and nursing note reviewed.  Constitutional:      Appearance: Normal appearance. She is well-developed.  HENT:     Head: Normocephalic and atraumatic.     Right Ear: External ear normal.     Left Ear: External ear normal.  Nose: Nose normal.     Mouth/Throat:     Mouth: Mucous membranes are moist.     Pharynx: Oropharynx is clear.  Eyes:     Conjunctiva/sclera: Conjunctivae normal.  Cardiovascular:     Rate and Rhythm: Normal rate  and regular rhythm.     Heart sounds: Normal heart sounds. No murmur heard.   Pulmonary:     Effort: Pulmonary effort is normal.     Breath sounds: Normal breath sounds. No wheezing, rhonchi or rales.  Musculoskeletal:     Cervical back: Neck supple.  Skin:    General: Skin is warm.     Findings: No rash.  Neurological:     Mental Status: She is alert and oriented to person, place, and time.  Psychiatric:        Behavior: Behavior normal.    Previous notes and tests were reviewed. The plan was reviewed with the patient/family, and all questions/concerned were addressed.  It was my pleasure to see Deanna Owens today and participate in her care. Please feel free to contact me with any questions or concerns.  Sincerely,  Wyline Mood, DO Allergy & Immunology  [facility] office: [phone] [facility] office: [phone] [place] office: [phone]

## 2020-08-15 NOTE — Progress Notes (Signed)
This encounter was created in error - please disregard.

## 2020-08-16 ENCOUNTER — Encounter: Payer: Self-pay | Admitting: Allergy and Immunology

## 2020-08-16 ENCOUNTER — Ambulatory Visit: Payer: Medicaid Other | Admitting: Allergy & Immunology

## 2020-08-16 ENCOUNTER — Ambulatory Visit (INDEPENDENT_AMBULATORY_CARE_PROVIDER_SITE_OTHER): Payer: Medicaid Other | Admitting: Allergy and Immunology

## 2020-08-16 VITALS — BP 110/84 | HR 96 | Temp 98.7°F | Resp 18

## 2020-08-16 DIAGNOSIS — J4540 Moderate persistent asthma, uncomplicated: Secondary | ICD-10-CM

## 2020-08-16 DIAGNOSIS — H1013 Acute atopic conjunctivitis, bilateral: Secondary | ICD-10-CM | POA: Diagnosis not present

## 2020-08-16 DIAGNOSIS — T7800XD Anaphylactic reaction due to unspecified food, subsequent encounter: Secondary | ICD-10-CM

## 2020-08-16 DIAGNOSIS — J3089 Other allergic rhinitis: Secondary | ICD-10-CM | POA: Diagnosis not present

## 2020-08-16 DIAGNOSIS — T7800XA Anaphylactic reaction due to unspecified food, initial encounter: Secondary | ICD-10-CM

## 2020-08-16 NOTE — Assessment & Plan Note (Signed)
Improved and currently well controlled.  Continue Dulera 200-5 mcg, 2 inhalations via spacer device twice daily.  Continue Stiolto Respimat, 2 inhalations once daily.  Continue albuterol HFA, 1 to 2 inhalations every 4-6 hours if needed.  Subjective and objective measures of pulmonary function will be followed and the treatment plan will be adjusted accordingly.

## 2020-08-16 NOTE — Assessment & Plan Note (Signed)
   Lab results were discussed and the patient has verbalized understanding.  Continue careful avoidance of peanuts, tree nuts, sesame seed, sunflower seed, and shellfish and have access to epinephrine autoinjector 2 pack in case of accidental ingestion.  Food allergy action plan is in place.

## 2020-08-16 NOTE — Progress Notes (Signed)
Follow-up Note  RE: Deanna Owens MRN: [id number] DOB: 10-10-1986 Date of Office Visit: 08/16/2020  Primary care provider: List, Nash Shearer, FNP Referring provider: List, Nash Shearer, FNP  History of present illness: Deanna Owens is a 34 y.o. female with persistent asthma, allergic rhinitis, and history of food allergies presenting today for follow-up.  She reports that after having had her labs drawn, however before getting the results back, she consumed some shrimp and experienced a pruritic "tingling" sensation in her mouth and on her tongue.  She is here today to discuss lab results.  She reports that her asthma has been "doing awesome" while taking Stiolto and Dulera 200 g, 2 inhalations via spacer device once daily.  While on this regimen, she rarely requires albuterol rescue and does not experience limitations in normal daily activities or nocturnal awakenings due to lower respiratory symptoms.  She reports that her nasal allergy symptoms have been well controlled with as needed prescription medications.  Assessment and plan: Food allergy  Lab results were discussed and the patient has verbalized understanding.  Continue careful avoidance of peanuts, tree nuts, sesame seed, sunflower seed, and shellfish and have access to epinephrine autoinjector 2 pack in case of accidental ingestion.  Food allergy action plan is in place.  Moderate persistent asthma Improved and currently well controlled.  Continue Dulera 200-5 mcg, 2 inhalations via spacer device twice daily.  Continue Stiolto Respimat, 2 inhalations once daily.  Continue albuterol HFA, 1 to 2 inhalations every 4-6 hours if needed.  Subjective and objective measures of pulmonary function will be followed and the treatment plan will be adjusted accordingly.  Seasonal and perennial allergic rhinitis Stable.  Continue appropriate allergen avoidance measures.  Continue levocetirizine 5 mg daily if needed.  Continue  azelastine nasal spray, 1 to 2 sprays per nostril 1-2 times daily if needed.  Nasal saline spray (i.e., Simply Saline) or nasal saline lavage (i.e., NeilMed) is recommended as needed and prior to medicated nasal sprays.  If allergen avoidance measures and medications fail to adequately relieve symptoms, aeroallergen immunotherapy will be considered.  Diagnostics: Spirometry:  Normal with an FEV1 of 88% predicted. This study was performed while the patient was asymptomatic.  Please see scanned spirometry results for details.    Physical examination: Blood pressure 110/84, pulse 96, temperature 98.7 F (37.1 C), temperature source Oral, resp. rate 18, SpO2 99 %.  General: Alert, interactive, in no acute distress. Neck: Supple without lymphadenopathy. Lungs: Clear to auscultation without wheezing, rhonchi or rales. CV: Normal S1, S2 without murmurs. Skin: Warm and dry, without lesions or rashes.  The following portions of the patient's history were reviewed and updated as appropriate: allergies, current medications, past family history, past medical history, past social history, past surgical history and problem list.  Current Outpatient Medications  Medication Sig Dispense Refill  . azelastine (ASTELIN) 0.1 % nasal spray One to two sprays each nostril twice a day as needed. 30 mL 5  . EPINEPHrine 0.3 mg/0.3 mL IJ SOAJ injection Use as directed for severe allergic reaction. 2 each 3  . fluticasone (FLOVENT HFA) 220 MCG/ACT inhaler Inhale 2 puffs into the lungs 2 (two) times daily. 1 Inhaler 5  . gabapentin (NEURONTIN) 600 MG tablet Take 600 mg by mouth 3 (three) times daily.     Marland Kitchen HYDRALAZINE-HCTZ PO Take by mouth.    Marland Kitchen ipratropium-albuterol (DUONEB) 0.5-2.5 (3) MG/3ML SOLN Use 1 vial via nebulizer 4 times daily and as needed for 5 days  1  .  losartan (COZAAR) 50 MG tablet Take by mouth.    . medroxyPROGESTERone (DEPO-PROVERA) 150 MG/ML injection Inject 150 mg into the muscle every 3  (three) months.    . mometasone-formoterol (DULERA) 200-5 MCG/ACT AERO Two puffs with spacer twice a day 13 g 5  . montelukast (SINGULAIR) 10 MG tablet Take 1 tablet (10 mg total) by mouth at bedtime. 30 tablet 5  . PROAIR HFA 108 (90 Base) MCG/ACT inhaler INHALE 2 PUFFS INTO THE LUNGS EVERY 4 HOURS AS NEEDED FOR WHEEZING OR SHORTNESS OF BREATH 18 g 0   No current facility-administered medications for this visit.    Allergies  Allergen Reactions  . Ketorolac Anaphylaxis  . Lisinopril Swelling  . Tramadol Anaphylaxis  . Peanut-Containing Drug Products     Tree nuts  . Propranolol     I appreciate the opportunity to take part in Deanna Owens's care. Please do not hesitate to contact me with questions.  Sincerely,   R. Jorene Guest, MD

## 2020-08-16 NOTE — Assessment & Plan Note (Signed)
Stable.  Continue appropriate allergen avoidance measures.  Continue levocetirizine 5 mg daily if needed.  Continue azelastine nasal spray, 1 to 2 sprays per nostril 1-2 times daily if needed.  Nasal saline spray (i.e., Simply Saline) or nasal saline lavage (i.e., NeilMed) is recommended as needed and prior to medicated nasal sprays.  If allergen avoidance measures and medications fail to adequately relieve symptoms, aeroallergen immunotherapy will be considered.

## 2020-08-16 NOTE — Patient Instructions (Signed)
Food allergy  Lab results were discussed and the patient has verbalized understanding.  Continue careful avoidance of peanuts, tree nuts, sesame seed, sunflower seed, and shellfish and have access to epinephrine autoinjector 2 pack in case of accidental ingestion.  Food allergy action plan is in place.  Moderate persistent asthma Improved and currently well controlled.  Continue Dulera 200-5 mcg, 2 inhalations via spacer device twice daily.  Continue Stiolto Respimat, 2 inhalations once daily.  Continue albuterol HFA, 1 to 2 inhalations every 4-6 hours if needed.  Subjective and objective measures of pulmonary function will be followed and the treatment plan will be adjusted accordingly.  Seasonal and perennial allergic rhinitis Stable.  Continue appropriate allergen avoidance measures.  Continue levocetirizine 5 mg daily if needed.  Continue azelastine nasal spray, 1 to 2 sprays per nostril 1-2 times daily if needed.  Nasal saline spray (i.e., Simply Saline) or nasal saline lavage (i.e., NeilMed) is recommended as needed and prior to medicated nasal sprays.  If allergen avoidance measures and medications fail to adequately relieve symptoms, aeroallergen immunotherapy will be considered.   No follow-ups on file.

## 2020-08-30 ENCOUNTER — Other Ambulatory Visit: Payer: Self-pay | Admitting: Allergy and Immunology

## 2020-09-09 ENCOUNTER — Other Ambulatory Visit: Payer: Self-pay | Admitting: Family Medicine

## 2020-10-03 ENCOUNTER — Emergency Department (HOSPITAL_BASED_OUTPATIENT_CLINIC_OR_DEPARTMENT_OTHER): Payer: No Typology Code available for payment source

## 2020-10-03 ENCOUNTER — Other Ambulatory Visit: Payer: Self-pay

## 2020-10-03 ENCOUNTER — Emergency Department (HOSPITAL_BASED_OUTPATIENT_CLINIC_OR_DEPARTMENT_OTHER)
Admission: EM | Admit: 2020-10-03 | Discharge: 2020-10-03 | Disposition: A | Discharge status: Home / Self Care | Payer: No Typology Code available for payment source | Attending: Emergency Medicine | Admitting: Emergency Medicine

## 2020-10-03 ENCOUNTER — Encounter (HOSPITAL_BASED_OUTPATIENT_CLINIC_OR_DEPARTMENT_OTHER): Payer: Self-pay | Admitting: Emergency Medicine

## 2020-10-03 DIAGNOSIS — M542 Cervicalgia: Secondary | ICD-10-CM | POA: Insufficient documentation

## 2020-10-03 DIAGNOSIS — Y92410 Unspecified street and highway as the place of occurrence of the external cause: Secondary | ICD-10-CM | POA: Insufficient documentation

## 2020-10-03 DIAGNOSIS — Z87891 Personal history of nicotine dependence: Secondary | ICD-10-CM | POA: Diagnosis not present

## 2020-10-03 DIAGNOSIS — R0789 Other chest pain: Secondary | ICD-10-CM

## 2020-10-03 DIAGNOSIS — Z91010 Allergy to peanuts: Secondary | ICD-10-CM | POA: Diagnosis not present

## 2020-10-03 DIAGNOSIS — M25512 Pain in left shoulder: Secondary | ICD-10-CM | POA: Diagnosis present

## 2020-10-03 DIAGNOSIS — J4540 Moderate persistent asthma, uncomplicated: Secondary | ICD-10-CM | POA: Diagnosis not present

## 2020-10-03 DIAGNOSIS — Z79899 Other long term (current) drug therapy: Secondary | ICD-10-CM | POA: Diagnosis not present

## 2020-10-03 DIAGNOSIS — Z7951 Long term (current) use of inhaled steroids: Secondary | ICD-10-CM | POA: Diagnosis not present

## 2020-10-03 DIAGNOSIS — I10 Essential (primary) hypertension: Secondary | ICD-10-CM | POA: Insufficient documentation

## 2020-10-03 DIAGNOSIS — M549 Dorsalgia, unspecified: Secondary | ICD-10-CM | POA: Insufficient documentation

## 2020-10-03 MED ORDER — METHOCARBAMOL 500 MG PO TABS: 500.0000 mg | ORAL_TABLET | Freq: Two times a day (BID) | ORAL | 0 refills | Status: AC

## 2020-10-03 MED ORDER — METHOCARBAMOL 500 MG PO TABS
1000.0000 mg | ORAL_TABLET | Freq: Once | ORAL | Status: AC
  Administered 2020-10-03: 1000 mg via ORAL
  Filled 2020-10-03: qty 2
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
## 2020-10-09 ENCOUNTER — Other Ambulatory Visit: Payer: Self-pay | Admitting: Allergy and Immunology

## 2021-06-15 ENCOUNTER — Other Ambulatory Visit: Payer: Self-pay | Admitting: Allergy

## 2021-06-18 ENCOUNTER — Telehealth: Payer: Self-pay

## 2021-06-18 NOTE — Telephone Encounter (Signed)
Left detailed message letting patient know that we sent in one refill of the medication her pharmacy requested because she is due for appointment. I asked her to call the office to schedule the follow-up.

## 2021-06-18 NOTE — Telephone Encounter (Signed)
Appointment scheduled for 06/25/21.

## 2021-06-25 ENCOUNTER — Ambulatory Visit: Payer: Medicaid Other | Admitting: Family Medicine

## 2021-06-25 NOTE — Progress Notes (Deleted)
   [address] [place] [address] Dept: [phone]  FOLLOW UP NOTE  Patient ID: Deanna Owens, female    DOB: 03/31/1986  Age: 35 y.o. MRN: [id number] Date of Office Visit: 06/25/2021  Assessment  Chief Complaint: No chief complaint on file.  HPI Deanna Owens    Drug Allergies:  Allergies  Allergen Reactions   Ketorolac Anaphylaxis   Lisinopril Swelling   Tramadol Anaphylaxis   Peanut-Containing Drug Products     Tree nuts   Propranolol     Physical Exam: There were no vitals taken for this visit.   Physical Exam  Diagnostics:    Assessment and Plan: No diagnosis found.  No orders of the defined types were placed in this encounter.   There are no Patient Instructions on file for this visit.  No follow-ups on file.    Thank you for the opportunity to care for this patient.  Please do not hesitate to contact me with questions.  Thermon Leyland, FNP [facility]

## 2021-07-23 ENCOUNTER — Other Ambulatory Visit: Payer: Self-pay | Admitting: Allergy

## 2021-07-23 NOTE — Telephone Encounter (Signed)
Medication denied. Courtesy refill already given

## 2021-08-21 ENCOUNTER — Telehealth: Payer: Self-pay | Admitting: Allergy

## 2021-08-21 NOTE — Telephone Encounter (Signed)
Pt request refill for STIOLTO pt has a scheduled appt for 9/29.

## 2021-08-27 NOTE — Telephone Encounter (Signed)
Tried to call patient but could not get through and no option to leave message.

## 2021-08-28 NOTE — Telephone Encounter (Signed)
Spoke with patient verbally gave message from Dr. Delorse Lek. She was transferred to the front to see if they had any earlier appointments.

## 2021-09-12 ENCOUNTER — Ambulatory Visit (INDEPENDENT_AMBULATORY_CARE_PROVIDER_SITE_OTHER): Payer: Medicaid Other | Admitting: Family Medicine

## 2021-09-12 ENCOUNTER — Other Ambulatory Visit: Payer: Self-pay

## 2021-09-12 ENCOUNTER — Encounter: Payer: Self-pay | Admitting: Family Medicine

## 2021-09-12 VITALS — BP 120/78 | HR 92 | Temp 98.2°F | Resp 18 | Ht 65.0 in | Wt 159.8 lb

## 2021-09-12 DIAGNOSIS — T7800XA Anaphylactic reaction due to unspecified food, initial encounter: Secondary | ICD-10-CM

## 2021-09-12 DIAGNOSIS — J3089 Other allergic rhinitis: Secondary | ICD-10-CM

## 2021-09-12 DIAGNOSIS — T7800XD Anaphylactic reaction due to unspecified food, subsequent encounter: Secondary | ICD-10-CM | POA: Diagnosis not present

## 2021-09-12 DIAGNOSIS — J4540 Moderate persistent asthma, uncomplicated: Secondary | ICD-10-CM

## 2021-09-12 MED ORDER — DULERA 200-5 MCG/ACT IN AERO: INHALATION_SPRAY | RESPIRATORY_TRACT | 5 refills | Status: DC

## 2021-09-12 MED ORDER — STIOLTO RESPIMAT 2.5-2.5 MCG/ACT IN AERS: 2.0000 | INHALATION_SPRAY | Freq: Every day | RESPIRATORY_TRACT | 0 refills | Status: DC

## 2021-09-12 MED ORDER — IPRATROPIUM-ALBUTEROL 0.5-2.5 (3) MG/3ML IN SOLN: 3.0000 mL | Freq: Four times a day (QID) | RESPIRATORY_TRACT | 2 refills | Status: DC | PRN

## 2021-09-12 NOTE — Patient Instructions (Addendum)
Asthma Continue Stiolto 2 puffs once a day to prevent cough or wheeze Continue albuterol 2 puffs once every 4 hours as needed for cough or wheeze You may use albuterol 2 puffs 5 to 15 minutes before activity to decrease cough or wheeze  Allergic rhinitis Continue allergen avoidance measures directed toward grass pollen, weed pollen, ragweed pollen, tree pollen, mold, cat, dog, cockroach, and dust mite as listed below Continue levocetirizine 5 mg once a day as needed for runny nose or itch Continue azelastine 2 sprays in each nostril up to twice a day as needed for runny nose Consider saline nasal rinses as needed for nasal symptoms. 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  Continue to avoid peanuts, tree nuts, and sesame.  In case of an allergic reaction, take Benadryl 50 mg every 4 hours, and if life-threatening symptoms occur, inject with EpiPen 0.3 mg. We have ordered a lab to help [place] evaluate your food allergy.  We will call you when the result becomes available.  Call the clinic if this treatment plan is not working well for you.  Follow up in 6 months or sooner if needed.  Reducing Pollen Exposure The American Academy of Allergy, Asthma and Immunology suggests the following steps to reduce your exposure to pollen during allergy seasons. Do not hang sheets or clothing out to dry; pollen may collect on these items. Do not mow lawns or spend time around freshly cut grass; mowing stirs up pollen. Keep windows closed at night.  Keep car windows closed while driving. Minimize morning activities outdoors, a time when pollen counts are usually at their highest. Stay indoors as much as possible when pollen counts or humidity is high and on windy days when pollen tends to remain in the air  longer. Use air conditioning when possible.  Many air conditioners have filters that trap the pollen spores. Use a HEPA room air filter to remove pollen form the indoor air you breathe.  Control of Mold Allergen Mold and fungi can grow on a variety of surfaces provided certain temperature and moisture conditions exist.  Outdoor molds grow on plants, decaying vegetation and soil.  The major outdoor mold, Alternaria and Cladosporium, are found in very high numbers during hot and dry conditions.  Generally, a late Summer - Fall peak is seen for common outdoor fungal spores.  Rain will temporarily lower outdoor mold spore count, but counts rise rapidly when the rainy period ends.  The most important indoor molds are Aspergillus and Penicillium.  Dark, humid and poorly ventilated basements are ideal sites for mold growth.  The next most common sites of mold growth are the bathroom and the kitchen.  Outdoor [employer] Use air conditioning and keep windows closed Avoid exposure to decaying vegetation. Avoid leaf raking. Avoid grain handling. Consider wearing a face mask if working in moldy areas.  Indoor Mold Control Maintain humidity below 50%. Clean washable surfaces with 5% bleach solution. Remove sources e.g. Contaminated carpets.  Control of Dog or Cat Allergen Avoidance is the best way to manage a dog or cat allergy. If you have a dog or cat and are allergic to dog or cats, consider removing the dog or cat from the home. If you have a dog or cat but don't want to find it a new home,  or if your family wants a pet even though someone in the household is allergic, here are some strategies that may help keep symptoms at bay:  Keep the pet out of your bedroom and restrict it to only a few rooms. Be advised that keeping the dog or cat in only one room will not limit the allergens to that room. Don't pet, hug or kiss the dog or cat; if you do, wash your hands with soap and water. High-efficiency  particulate air (HEPA) cleaners run continuously in a bedroom or living room can reduce allergen levels over time. Regular use of a high-efficiency vacuum cleaner or a central vacuum can reduce allergen levels. Giving your dog or cat a bath at least once a week can reduce airborne allergen.  Control of Cockroach Allergen  Cockroach allergen has been identified as an important cause of acute attacks of asthma, especially in urban settings.  There are fifty-five species of cockroach that exist in the [place], however only three, the [place], [place] species produce allergen that can affect patients with Asthma.  Allergens can be obtained from fecal particles, egg casings and secretions from cockroaches.    Remove food sources. Reduce access to water. Seal access and entry points. Spray runways with 0.5-1% Diazinon or Chlorpyrifos Blow boric acid power under stoves and refrigerator. Place bait stations (hydramethylnon) at feeding sites.    Control of Dust Mite Allergen Dust mites play a major role in allergic asthma and rhinitis. They occur in environments with high humidity wherever human skin is found. Dust mites absorb humidity from the atmosphere (ie, they do not drink) and feed on organic matter (including shed human and animal skin). Dust mites are a microscopic type of insect that you cannot see with the naked eye. High levels of dust mites have been detected from mattresses, pillows, carpets, upholstered furniture, bed covers, clothes, soft toys and any woven material. The principal allergen of the dust mite is found in its feces. A gram of dust may contain 1,000 mites and 250,000 fecal particles. Mite antigen is easily measured in the air during house cleaning activities. Dust mites do not bite and do not cause harm to humans, other than by triggering allergies/asthma.  Ways to decrease your exposure to dust mites in your home:  1. Encase mattresses, box springs and  pillows with a mite-impermeable barrier or cover  2. Wash sheets, blankets and drapes weekly in hot water (130 F) with detergent and dry them in a dryer on the hot setting.  3. Have the room cleaned frequently with a vacuum cleaner and a damp dust-mop. For carpeting or rugs, vacuuming with a vacuum cleaner equipped with a high-efficiency particulate air (HEPA) filter. The dust mite allergic individual should not be in a room which is being cleaned and should wait 1 hour after cleaning before going into the room.  4. Do not sleep on upholstered furniture (eg, couches).  5. If possible removing carpeting, upholstered furniture and drapery from the home is ideal. Horizontal blinds should be eliminated in the rooms where the person spends the most time (bedroom, study, television room). Washable vinyl, roller-type shades are optimal.  6. Remove all non-washable stuffed toys from the bedroom. Wash stuffed toys weekly like sheets and blankets above.  7. Reduce indoor humidity to less than 50%. Inexpensive humidity monitors can be purchased at most hardware stores. Do not use a humidifier as can make the problem worse and are not recommended.

## 2021-09-12 NOTE — Progress Notes (Signed)
[address] [place]  [address] Dept: [phone]  FOLLOW UP NOTE  Patient ID: Deanna Owens, female    DOB: 05-14-1986  Age: 35 y.o. MRN: [id number] Date of Office Visit: 09/12/2021  Assessment  Chief Complaint: Follow-up and Asthma (Pt states that her asthma symptoms have been well managed, but she is interested in retesting for peanuts, sesame, and shellfish.)  HPI Deanna Owens is a 35 year old female who presents to the clinic for follow-up visit.  She was last seen in this clinic on 08/16/2020 by Dr. Nunzio Cobbs for evaluation of asthma, allergic rhinitis, and food allergy to peanut, tree nut, sesame, sunflower, and shellfish.  At today's visit, she reports her asthma has been well controlled with no shortness of breath, cough, or wheeze with activity or rest.  She continues Stiolto 2 puffs once a day and has not needed to use albuterol since her last visit to this clinic.  She is not currently taking montelukast at this time as she felt her symptoms were well controlled without it.  Allergic rhinitis is reported as well controlled with Allegra-D daily.  She is not currently using any nasal saline rinses or antihistamine nasal spray.  She continues to avoid peanuts, tree nuts, and sesame with no adverse reaction or EpiPen use since her last visit to this clinic.  She does report that she is eating shellfish and sunflower seeds on a regular basis with no adverse reaction. She is interested in updating her food allergy testing at today's visit and hopes to reintroduce these foods into her diet.  Her current medications are listed in the chart.   Drug Allergies:  Allergies  Allergen Reactions   Ketorolac Anaphylaxis   Lisinopril Swelling   Tramadol Anaphylaxis   Peanut-Containing Drug Products     Tree nuts   Propranolol     Physical Exam: BP 120/78   Pulse 92   Temp 98.2 F (36.8 C) (Temporal)   Resp 18   Ht 5\' 5"  (1.651 m)   Wt 159 lb 12.8 oz (72.5 kg)   SpO2 99%   BMI  26.59 kg/m    Physical Exam Vitals reviewed.  Constitutional:      Appearance: Normal appearance.  HENT:     Head: Normocephalic and atraumatic.     Right Ear: Tympanic membrane normal.     Left Ear: Tympanic membrane normal.     Nose:     Comments: Bilateral nares slightly erythematous with clear nasal drainage noted.  Pharynx normal.  Ears normal.  Eyes normal.    Mouth/Throat:     Pharynx: Oropharynx is clear.  Eyes:     Conjunctiva/sclera: Conjunctivae normal.  Cardiovascular:     Rate and Rhythm: Normal rate and regular rhythm.     Heart sounds: Normal heart sounds. No murmur heard. Pulmonary:     Effort: Pulmonary effort is normal.     Breath sounds: Normal breath sounds.     Comments: Lungs clear to auscultation Musculoskeletal:        General: Normal range of motion.     Cervical back: Normal range of motion and neck supple.  Skin:    General: Skin is warm and dry.  Neurological:     Mental Status: She is alert and oriented to person, place, and time.  Psychiatric:        Mood and Affect: Mood normal.        Behavior: Behavior normal.        Thought Content: Thought content  normal.        Judgment: Judgment normal.    Diagnostics: FVC 2.81, FEV1 2.14.  Predicted FVC 3.31, predicted FEV1 2.75.  Spirometry indicates normal ventilatory function.  Assessment and Plan: 1. Moderate persistent asthma without complication   2. Food allergy   3. Seasonal and perennial allergic rhinitis     Meds ordered this encounter  Medications   DISCONTD: ipratropium-albuterol (DUONEB) 0.5-2.5 (3) MG/3ML SOLN    Sig: Take 3 mLs by nebulization every 6 (six) hours as needed. Use 1 vial via nebulizer 4 times daily and as needed for 5 days    Dispense:  360 mL    Refill:  2   DISCONTD: mometasone-formoterol (DULERA) 200-5 MCG/ACT AERO    Sig: INHALE 2 PUFFS VIA SPACER TWICE DAILY    Dispense:  13 g    Refill:  5   Tiotropium Bromide-Olodaterol (STIOLTO RESPIMAT) 2.5-2.5 MCG/ACT  AERS    Sig: Inhale 2 puffs into the lungs daily.    Dispense:  4 g    Refill:  0    Patient Instructions  Asthma Continue Stiolto 2 puffs once a day to prevent cough or wheeze Continue albuterol 2 puffs once every 4 hours as needed for cough or wheeze You may use albuterol 2 puffs 5 to 15 minutes before activity to decrease cough or wheeze  Allergic rhinitis Continue allergen avoidance measures directed toward grass pollen, weed pollen, ragweed pollen, tree pollen, mold, cat, dog, cockroach, and dust mite as listed below Continue levocetirizine 5 mg once a day as needed for runny nose or itch Continue azelastine 2 sprays in each nostril up to twice a day as needed for runny nose Consider saline nasal rinses as needed for nasal symptoms. 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  Continue to avoid peanuts, tree nuts, and sesame.  In case of an allergic reaction, take Benadryl 50 mg every 4 hours, and if life-threatening symptoms occur, inject with EpiPen 0.3 mg. We have ordered a lab to help [place] evaluate your food allergy.  We will call you when the result becomes available.  Call the clinic if this treatment plan is not working well for you.  Follow up in 6 months or sooner if needed.  Return in about 6 months (around 03/12/2022), or if symptoms worsen or fail to improve.    Thank you for the opportunity to care for this patient.  Please do not hesitate to contact me with questions.  Thermon Leyland, FNP [facility]

## 2021-10-07 ENCOUNTER — Encounter (HOSPITAL_BASED_OUTPATIENT_CLINIC_OR_DEPARTMENT_OTHER): Payer: Self-pay | Admitting: Emergency Medicine

## 2021-10-07 ENCOUNTER — Other Ambulatory Visit: Payer: Self-pay

## 2021-10-07 ENCOUNTER — Emergency Department (HOSPITAL_BASED_OUTPATIENT_CLINIC_OR_DEPARTMENT_OTHER)
Admission: EM | Admit: 2021-10-07 | Discharge: 2021-10-07 | Disposition: A | Discharge status: Home / Self Care | Payer: Medicaid Other | Attending: Emergency Medicine | Admitting: Emergency Medicine

## 2021-10-07 DIAGNOSIS — J3489 Other specified disorders of nose and nasal sinuses: Secondary | ICD-10-CM | POA: Diagnosis not present

## 2021-10-07 DIAGNOSIS — R059 Cough, unspecified: Secondary | ICD-10-CM | POA: Insufficient documentation

## 2021-10-07 DIAGNOSIS — Z87891 Personal history of nicotine dependence: Secondary | ICD-10-CM | POA: Diagnosis not present

## 2021-10-07 DIAGNOSIS — R0981 Nasal congestion: Secondary | ICD-10-CM | POA: Diagnosis not present

## 2021-10-07 DIAGNOSIS — M791 Myalgia, unspecified site: Secondary | ICD-10-CM | POA: Diagnosis not present

## 2021-10-07 DIAGNOSIS — R519 Headache, unspecified: Secondary | ICD-10-CM | POA: Insufficient documentation

## 2021-10-07 DIAGNOSIS — Z1152 Encounter for screening for COVID-19: Secondary | ICD-10-CM | POA: Diagnosis not present

## 2021-10-07 DIAGNOSIS — R509 Fever, unspecified: Secondary | ICD-10-CM | POA: Diagnosis not present

## 2021-10-07 DIAGNOSIS — Z20822 Contact with and (suspected) exposure to covid-19: Secondary | ICD-10-CM

## 2021-10-07 DIAGNOSIS — J4540 Moderate persistent asthma, uncomplicated: Secondary | ICD-10-CM | POA: Diagnosis not present

## 2021-10-07 DIAGNOSIS — Z91010 Allergy to peanuts: Secondary | ICD-10-CM | POA: Insufficient documentation

## 2021-10-07 DIAGNOSIS — I10 Essential (primary) hypertension: Secondary | ICD-10-CM | POA: Diagnosis not present

## 2021-10-07 DIAGNOSIS — Z79899 Other long term (current) drug therapy: Secondary | ICD-10-CM | POA: Insufficient documentation

## 2021-10-07 DIAGNOSIS — Z7951 Long term (current) use of inhaled steroids: Secondary | ICD-10-CM | POA: Insufficient documentation

## 2021-10-07 MED ORDER — FLUTICASONE PROPIONATE 50 MCG/ACT NA SUSP: 2.0000 | Freq: Every day | NASAL | 0 refills | Status: AC

## 2021-10-07 NOTE — ED Notes (Signed)
Discharge instructions discussed with pt. Pt verbalized understanding. Pt stable and ambulatory. No signature pad available. 

## 2021-10-07 NOTE — ED Provider Notes (Signed)
[facility] [place] EMERGENCY DEPARTMENT Provider Note   CSN: [id number] Arrival date & time: 10/07/21  0353     History Chief Complaint  Patient presents with   Headache   Cough    Deanna Owens is a 34 y.o. female.  The history is provided by the patient.  Headache Pain location:  Frontal Quality:  Dull Radiates to:  Does not radiate Onset quality:  Gradual Duration:  3 days Timing:  Constant Progression:  Waxing and waning Chronicity:  New Context: coughing   Context: not activity, not exposure to bright light and not caffeine   Relieved by:  Nothing Worsened by:  Nothing Ineffective treatments:  Acetaminophen (allergic to NSAIDs, she has congestion and flu symptoms and her sone currently has the flu) Associated symptoms: congestion, cough, fever, myalgias and URI   Associated symptoms: no abdominal pain, no back pain, no eye pain, no facial pain, no fatigue, no nausea, no neck pain, no neck stiffness, no photophobia, no seizures, no sore throat, no swollen glands, no syncope and no weakness   Associated symptoms comment:  Nasal congestion and rhinorrhea.  Cough Associated symptoms: fever, headaches, myalgias and rhinorrhea   Associated symptoms: no chest pain, no rash, no shortness of breath, no sore throat and no wheezing       Past Medical History:  Diagnosis Date   Asthma    Bell's palsy    Hypertension    Migraine     Patient Active Problem List   Diagnosis Date Noted   Elevated blood pressure reading 02/24/2020   Food allergy 02/23/2020   Seasonal and perennial allergic rhinitis 02/23/2020   Allergic conjunctivitis 02/23/2020   Moderate persistent asthma 08/11/2018   Asthma attack 08/11/2018   Polysubstance abuse (HCC) 04/25/2017   Hypertensive urgency 04/25/2017   Hypokalemia 04/25/2017   Left-sided weakness 04/24/2017   Left sided numbness 04/24/2017    Past Surgical History:  Procedure Laterality Date   HERNIA REPAIR     WISDOM TOOTH  EXTRACTION       OB History   No obstetric history on file.     Family History  Problem Relation Age of Onset   Asthma Son    Allergic rhinitis Son    Urticaria Son     Social History   Tobacco Use   Smoking status: Former    Packs/day: 0.50    Types: Cigarettes   Smokeless tobacco: Never  Vaping Use   Vaping Use: Never used  Substance Use Topics   Alcohol use: Not Currently    Comment: daily   Drug use: Not Currently    Types: Marijuana    Home Medications Prior to Admission medications   Medication Sig Start Date End Date Taking? Authorizing Provider  amLODipine (NORVASC) 10 MG tablet amlodipine 10 mg tablet    [provider]  azelastine (ASTELIN) 0.1 % nasal spray One to two sprays each nostril twice a day as needed. Patient not taking: Reported on 09/12/2021 02/23/20   Bobbitt, Heywood Iles, MD  EPINEPHrine 0.3 mg/0.3 mL IJ SOAJ injection Use as directed for severe allergic reaction. 02/23/20   Bobbitt, Heywood Iles, MD  fluticasone (FLOVENT HFA) 220 MCG/ACT inhaler Inhale 2 puffs into the lungs 2 (two) times daily. 02/24/20   Bobbitt, Heywood Iles, MD  gabapentin (NEURONTIN) 600 MG tablet Take 600 mg by mouth 3 (three) times daily.     [provider]  HYDRALAZINE-HCTZ PO Take by mouth.    [provider]  losartan (  COZAAR) 50 MG tablet Take by mouth.    [provider]  medroxyPROGESTERone (DEPO-PROVERA) 150 MG/ML injection Inject 150 mg into the muscle every 3 (three) months. Patient not taking: Reported on 09/12/2021 02/22/20   [provider]  methocarbamol (ROBAXIN) 500 MG tablet Take 1 tablet (500 mg total) by mouth 2 (two) times daily. Patient not taking: Reported on 09/12/2021 10/03/20   Gailen Shelter, PA  montelukast (SINGULAIR) 10 MG tablet Take 1 tablet (10 mg total) by mouth at bedtime. Patient not taking: Reported on 09/12/2021 02/23/20   Cristal Ford, MD  PROAIR HFA [phone] Base) MCG/ACT inhaler INHALE  2 PUFFS INTO THE LUNGS EVERY 4 HOURS AS NEEDED FOR WHEEZING OR SHORTNESS OF BREATH Patient not taking: Reported on 09/12/2021 09/10/20   Hetty Blend, FNP  Tiotropium Bromide-Olodaterol (STIOLTO RESPIMAT) 2.5-2.5 MCG/ACT AERS Inhale 2 puffs into the lungs daily. 09/12/21   Hetty Blend, FNP    Allergies    Ketorolac, Lisinopril, Tramadol, Peanut-containing drug products, and Propranolol  Review of Systems   Review of Systems  Constitutional:  Positive for fever. Negative for fatigue.  HENT:  Positive for congestion and rhinorrhea. Negative for sore throat, trouble swallowing and voice change.   Eyes:  Negative for photophobia and pain.  Respiratory:  Positive for cough. Negative for shortness of breath, wheezing and stridor.   Cardiovascular:  Negative for chest pain and syncope.  Gastrointestinal:  Negative for abdominal pain and nausea.  Genitourinary:  Negative for difficulty urinating.  Musculoskeletal:  Positive for myalgias. Negative for back pain, neck pain and neck stiffness.  Skin:  Negative for rash.  Neurological:  Positive for headaches. Negative for seizures and weakness.  Psychiatric/Behavioral:  Negative for agitation.   All other systems reviewed and are negative.  Physical Exam Updated Vital Signs BP 137/85 (BP Location: Right Arm)   Pulse (!) 110   Temp (!) 100.8 F (38.2 C) (Oral)   Resp 19   Ht 5\' 4"  (1.626 m)   Wt 68.9 kg   LMP 10/04/2021   SpO2 97%   BMI 26.09 kg/m   Physical Exam Vitals and nursing note reviewed.  Constitutional:      General: She is not in acute distress.    Appearance: Normal appearance.  HENT:     Head: Normocephalic and atraumatic.     Nose: Congestion and rhinorrhea present.     Mouth/Throat:     Mouth: Mucous membranes are moist.  Eyes:     Conjunctiva/sclera: Conjunctivae normal.     Pupils: Pupils are equal, round, and reactive to light.  Cardiovascular:     Rate and Rhythm: Normal rate and regular rhythm.     Pulses:  Normal pulses.     Heart sounds: Normal heart sounds.  Pulmonary:     Effort: Pulmonary effort is normal.     Breath sounds: Normal breath sounds.  Abdominal:     General: Abdomen is flat. Bowel sounds are normal.     Palpations: Abdomen is soft.     Tenderness: There is no abdominal tenderness. There is no guarding.  Musculoskeletal:        General: Normal range of motion.     Cervical back: Normal range of motion and neck supple. No rigidity.  Lymphadenopathy:     Cervical: No cervical adenopathy.  Skin:    General: Skin is warm and dry.     Capillary Refill: Capillary refill takes less than 2 seconds.  Neurological:  General: No focal deficit present.     Mental Status: She is alert and oriented to person, place, and time.     Deep Tendon Reflexes: Reflexes normal.  Psychiatric:        Mood and Affect: Mood normal.        Behavior: Behavior normal.    ED Results / Procedures / Treatments   Labs (all labs ordered are listed, but only abnormal results are displayed) Labs Reviewed  PREGNANCY, URINE    EKG None  Radiology No results found.  Procedures Procedures   Medications Ordered in ED Medications - No data to display  ED Course  I have reviewed the triage vital signs and the nursing notes.  Pertinent labs & imaging results that were available during my care of the patient were reviewed by me and considered in my medical decision making (see chart for details).    Likely flu as son has it.   COVID and flu sent, tylenol and flonase and copious liquids.    Deanna Owens was evaluated in Emergency Department on 10/07/2021 for the symptoms described in the history of present illness. She was evaluated in the context of the global COVID-19 pandemic, which necessitated consideration that the patient might be at risk for infection with the SARS-CoV-2 virus that causes COVID-19. Institutional protocols and algorithms that pertain to the evaluation of patients at  risk for COVID-19 are in a state of rapid change based on information released by regulatory bodies including the CDC and federal and state organizations. These policies and algorithms were followed during the patient's care in the ED.  Final Clinical Impression(s) / ED Diagnoses Final diagnoses:  Person under investigation for COVID-19   Return for intractable cough, coughing up blood, fevers > 100.4 unrelieved by medication, shortness of breath, intractable vomiting, chest pain, shortness of breath, weakness, numbness, changes in speech, facial asymmetry, abdominal pain, passing out, Inability to tolerate liquids or food, cough, altered mental status or any concerns. No signs of systemic illness or infection. The patient is nontoxic-appearing on exam and vital signs are within normal limits.  I have reviewed the triage vital signs and the nursing notes. Pertinent labs & imaging results that were available during my care of the patient were reviewed by me and considered in my medical decision making (see chart for details). After history, exam, and medical workup I feel the patient has been appropriately medically screened and is safe for discharge home. Pertinent diagnoses were discussed with the patient. Patient was given return precautions.  Rx / DC Orders ED Discharge Orders     None        Wasil Wolke, MD 10/07/21 [phone]

## 2021-10-07 NOTE — ED Triage Notes (Addendum)
Pt presents to ED POV. Pt c/o HA, cough, congestion since yesterday morning. Per pt son was +flu. Tylenol 2340 pta

## 2021-10-09 ENCOUNTER — Other Ambulatory Visit: Payer: Self-pay | Admitting: Family Medicine

## 2021-10-11 ENCOUNTER — Other Ambulatory Visit: Payer: Self-pay

## 2021-10-16 ENCOUNTER — Other Ambulatory Visit: Payer: Self-pay

## 2021-10-16 MED ORDER — DULERA 200-5 MCG/ACT IN AERO: 2.0000 | INHALATION_SPRAY | Freq: Two times a day (BID) | RESPIRATORY_TRACT | 3 refills | Status: AC

## 2021-10-16 NOTE — Telephone Encounter (Signed)
Refill for Dulera 200 mcg sent to [facility] x 1 with 3 refills.

## 2021-12-25 ENCOUNTER — Other Ambulatory Visit: Payer: Self-pay

## 2021-12-25 ENCOUNTER — Telehealth: Payer: Self-pay

## 2021-12-25 MED ORDER — EPINEPHRINE 0.3 MG/0.3ML IJ SOAJ: INTRAMUSCULAR | 3 refills | Status: AC

## 2021-12-25 NOTE — Telephone Encounter (Signed)
Sent in an updated epi pen 0.3mg  to [facility]

## 2021-12-25 NOTE — Telephone Encounter (Signed)
Patient calling to let us know she had a reaction last night not sure what caused it bc she eats all these foods all the time. She threw her expired epipens away that still would have been to some use if she needed them, but she said the pharmacist told her to throw them away. I told her she should have kept them bc they would have been good still. They were only expired by a few months. Patient started eating lemons and salt between 5:30-6:00 pm, then she started eating shrimp around 8:00 pm and stopped eating it around 8:50 last night. She ate salsa and chips around 3 or 4 that afternoon. She washed up and layed down around 9 and at 9:30 pm her arms started itching and her arms were breaking out with hives then the hives were eventually all over. She didn't have any breathing problems just hives. Patient took 2 benadryl and the hives were resolved by the morning. Patient saw you in September and we tested her for nuts and sesame which were positive to sesame and cashew. She never got her blood work done that was ordered in September. She had blood work done by Dr. Nunzio Cobbs back in 02/2020 negative to shellfish. Will send in an updated epipen 0.3 mg to [facility].

## 2021-12-26 NOTE — Telephone Encounter (Signed)
Sent in an updated epi pen 0.3 mg to her pharmacy. [facility]

## 2021-12-29 NOTE — Telephone Encounter (Signed)
Thank you for sending in updated epipens. If her symptoms re-occur, begin a journal of events that occurred for up to 6 hours before your symptoms began including foods and beverages consumed, soaps or perfumes you had contact with, and medications. Please have her make an appointment in the clinic if she wants to test any foods. Thank you

## 2021-12-30 NOTE — Telephone Encounter (Signed)
I called & spoke with pt and advise was related to her, pt verbalized understanding.

## 2022-01-06 ENCOUNTER — Encounter (HOSPITAL_BASED_OUTPATIENT_CLINIC_OR_DEPARTMENT_OTHER): Payer: Self-pay | Admitting: Emergency Medicine

## 2022-01-06 ENCOUNTER — Telehealth: Payer: Self-pay | Admitting: Family Medicine

## 2022-01-06 ENCOUNTER — Emergency Department (HOSPITAL_BASED_OUTPATIENT_CLINIC_OR_DEPARTMENT_OTHER)
Admission: EM | Admit: 2022-01-06 | Discharge: 2022-01-06 | Disposition: A | Discharge status: Home / Self Care | Payer: Medicaid Other | Attending: Emergency Medicine | Admitting: Emergency Medicine

## 2022-01-06 ENCOUNTER — Other Ambulatory Visit: Payer: Self-pay

## 2022-01-06 DIAGNOSIS — Z7951 Long term (current) use of inhaled steroids: Secondary | ICD-10-CM | POA: Diagnosis not present

## 2022-01-06 DIAGNOSIS — T7840XA Allergy, unspecified, initial encounter: Secondary | ICD-10-CM | POA: Insufficient documentation

## 2022-01-06 DIAGNOSIS — L509 Urticaria, unspecified: Secondary | ICD-10-CM | POA: Insufficient documentation

## 2022-01-06 DIAGNOSIS — L299 Pruritus, unspecified: Secondary | ICD-10-CM | POA: Diagnosis not present

## 2022-01-06 DIAGNOSIS — R21 Rash and other nonspecific skin eruption: Secondary | ICD-10-CM | POA: Insufficient documentation

## 2022-01-06 DIAGNOSIS — Z91010 Allergy to peanuts: Secondary | ICD-10-CM | POA: Insufficient documentation

## 2022-01-06 DIAGNOSIS — Z79899 Other long term (current) drug therapy: Secondary | ICD-10-CM | POA: Diagnosis not present

## 2022-01-06 MED ORDER — FAMOTIDINE 20 MG PO TABS
20.0000 mg | ORAL_TABLET | Freq: Once | ORAL | Status: AC
  Administered 2022-01-06: 20 mg via ORAL
  Filled 2022-01-06: qty 1

## 2022-01-06 MED ORDER — DIPHENHYDRAMINE HCL 25 MG PO CAPS
25.0000 mg | ORAL_CAPSULE | Freq: Once | ORAL | Status: AC
  Administered 2022-01-06: 25 mg via ORAL
  Filled 2022-01-06: qty 1

## 2022-01-06 NOTE — Discharge Instructions (Addendum)
Please follow-up with your allergist and your primary care doctor.  Recommend taking antihistamines as needed for itching, hives.  If you ever develop anaphylaxis and required the EpiPen, please come immediately to ER for reassessment or call 911.

## 2022-01-06 NOTE — Telephone Encounter (Signed)
Called pt back concerning ER visit, pt states that she ate lemon while studying last night around 1 am and went to sleep about 3 am she woke up itching severely and she was cover is hives, she took benadryl and it didn't seemed to help so she went to ER because she felt her breathing was off. And today they are still present and looking worst and itchy, warm to touch. She was advise by ER doctor to try Zyrtec 20 & Pepcid 20 twice a day until symptoms has improved. I advise pt to follow ER instruction until she hear from our office. We had order some labs for pt LOV, but she haven't done it. Please advise if ok to add lemons and hives panel to those labs.

## 2022-01-06 NOTE — ED Notes (Signed)
Pt reports taking 25mg  of Benadryl PO and benadryl cream PTA.

## 2022-01-06 NOTE — ED Triage Notes (Signed)
Pt states she woke this morning with itching and hives. Last week she had a similar reaction after eating shellfish and lemons. She attributed that reaction to the shellfish. This morning she ate lemons at 0100 and woke up at 0300 with hives. Denies other symptoms. Pt in NAD.

## 2022-01-06 NOTE — Telephone Encounter (Signed)
Pt states she was in Er due to hives, she had lemon, requesting a call back

## 2022-01-06 NOTE — Telephone Encounter (Signed)
Can you please have her avoid shrimp and lemon. Please have her carry epi pen set at all times. Please have her come into the clinic so we can get a hive work up going and decide what to do with her food allergies too. Please have her begin a journal of events that occurred for up to 6 hours before your symptoms began including foods and beverages consumed, soaps or perfumes you had contact with, and medications. Thank you

## 2022-01-06 NOTE — ED Provider Notes (Signed)
[facility] EMERGENCY DEPARTMENT Provider Note   CSN: [id number] Arrival date & time: 01/06/22  0345     History  Chief Complaint  Patient presents with   Allergic Reaction    Deanna Owens is a 36 y.o. female.  Presented to ER with concern for allergic reaction.  Patient states that evening she ate spicy wings from [employer].  Around 1 AM she ate a lemon.  Around 3 AM she woke up and noted hives, itching.  Rash isolated to neck, right shoulder.  No difficulty breathing, no throat swelling or throat tightness.  No lip or tongue swelling.  No difficulty in breathing, no wheezing.  No nausea or vomiting.  Took a dose of oral Benadryl and applied a Benadryl cream.  HPI     Home Medications Prior to Admission medications   Medication Sig Start Date End Date Taking? Authorizing Provider  amLODipine (NORVASC) 10 MG tablet amlodipine 10 mg tablet    [provider]  azelastine (ASTELIN) 0.1 % nasal spray One to two sprays each nostril twice a day as needed. Patient not taking: Reported on 09/12/2021 02/23/20   Bobbitt, Sedalia Muta, MD  EPINEPHrine 0.3 mg/0.3 mL IJ SOAJ injection Use as directed for severe allergic reaction. 12/25/21   Dara Hoyer, FNP  fluticasone (FLONASE) 50 MCG/ACT nasal spray Place 2 sprays into both nostrils daily. 10/07/21   Palumbo, April, MD  fluticasone (FLOVENT HFA) 220 MCG/ACT inhaler Inhale 2 puffs into the lungs 2 (two) times daily. 02/24/20   Bobbitt, Sedalia Muta, MD  gabapentin (NEURONTIN) 600 MG tablet Take 600 mg by mouth 3 (three) times daily.     [provider]  HYDRALAZINE-HCTZ PO Take by mouth.    [provider]  losartan (COZAAR) 50 MG tablet Take by mouth.    [provider]  medroxyPROGESTERone (DEPO-PROVERA) 150 MG/ML injection Inject 150 mg into the muscle every 3 (three) months. Patient not taking: Reported on 09/12/2021 02/22/20   [provider]  methocarbamol (ROBAXIN) 500 MG tablet Take 1  tablet (500 mg total) by mouth 2 (two) times daily. Patient not taking: Reported on 09/12/2021 10/03/20   Tedd Sias, PA  mometasone-formoterol (DULERA) 200-5 MCG/ACT AERO Inhale 2 puffs into the lungs 2 (two) times daily. Use with spacer. Rinse, gargle and spit out after use. 10/16/21   Dara Hoyer, FNP  montelukast (SINGULAIR) 10 MG tablet Take 1 tablet (10 mg total) by mouth at bedtime. Patient not taking: Reported on 09/12/2021 02/23/20   Adelina Mings, MD  PROAIR HFA 108 [phone] Base) MCG/ACT inhaler INHALE 2 PUFFS INTO THE LUNGS EVERY 4 HOURS AS NEEDED FOR WHEEZING OR SHORTNESS OF BREATH Patient not taking: Reported on 09/12/2021 09/10/20   Dara Hoyer, FNP  STIOLTO RESPIMAT 2.5-2.5 MCG/ACT AERS INHALE 2 PUFFS INTO THE LUNGS DAILY 10/10/21   Dara Hoyer, FNP      Allergies    Ketorolac, Lisinopril, Tramadol, Peanut-containing drug products, and Propranolol    Review of Systems   Review of Systems  Skin:  Positive for rash.  All other systems reviewed and are negative.  Physical Exam Updated Vital Signs BP 121/88 (BP Location: Right Arm)    Pulse 71    Temp 98 F (36.7 C) (Oral)    Resp 18    Ht 5\' 4"  (1.626 m)    Wt 74.8 kg    LMP 12/02/2021    SpO2 99%    BMI 28.32 kg/m  Physical  Exam Vitals and nursing note reviewed.  Constitutional:      General: She is not in acute distress.    Appearance: She is well-developed.  HENT:     Head: Normocephalic and atraumatic.  Eyes:     Conjunctiva/sclera: Conjunctivae normal.  Cardiovascular:     Rate and Rhythm: Normal rate and regular rhythm.     Heart sounds: No murmur heard. Pulmonary:     Effort: Pulmonary effort is normal. No respiratory distress.  Abdominal:     Palpations: Abdomen is soft.     Tenderness: There is no abdominal tenderness.  Musculoskeletal:        General: No swelling.     Cervical back: Neck supple.  Skin:    General: Skin is warm and dry.     Capillary Refill: Capillary refill takes less than 2  seconds.     Comments: There is small urticarial slightly raised, blanchable rash over the right neck/right shoulder  Neurological:     General: No focal deficit present.     Mental Status: She is alert.  Psychiatric:        Mood and Affect: Mood normal.    ED Results / Procedures / Treatments   Labs (all labs ordered are listed, but only abnormal results are displayed) Labs Reviewed - No data to display  EKG None  Radiology No results found.  Procedures Procedures    Medications Ordered in ED Medications  diphenhydrAMINE (BENADRYL) capsule 25 mg (25 mg Oral Given 01/06/22 0421)  famotidine (PEPCID) tablet 20 mg (20 mg Oral Given 01/06/22 0421)    ED Course/ Medical Decision Making/ A&P                           Medical Decision Making  36 year old lady presenting to ER with concern for itching and hives.  On physical exam noted to have small urticarial rash over the right neck/shoulder region.  Has prior history of hives, anaphylaxis.  Endorses known peanut allergy.  Had similar reaction a couple weeks ago after consuming shellfish and lemons.  Today only had lemons and no shellfish.  Also had consumed some other foods this evening.  Unclear what triggered the episode today.  Based on physical exam strong suspicion for urticaria/allergic reaction.  Patient was provided antihistamines.  Her itching resolved, rash improved.  She has no other associated symptoms.  Not consistent with anaphylaxis reaction.  Patient has previously seen allergist.  Strongly encouraged follow-up with her allergy specialist as well as her primary care physician discharged home.    Note patient accompanied by mother she provided some additional history.  Was updated throughout patient's stay.        Final Clinical Impression(s) / ED Diagnoses Final diagnoses:  Allergic reaction, initial encounter  Urticaria    Rx / DC Orders ED Discharge Orders     None         Lucrezia Starch,  MD 01/06/22 [phone]

## 2022-01-06 NOTE — Telephone Encounter (Signed)
LMOM about a second allergic reaction.

## 2022-01-06 NOTE — Telephone Encounter (Signed)
Deanna Owens msg was related to pt, she is aware and will take the journal and she is schedule for thursday at 10 am.

## 2022-01-08 NOTE — Patient Instructions (Addendum)
Asthma Continue albuterol 2 puffs once every 4 hours as needed for cough or wheeze You may use albuterol 2 puffs 5 to 15 minutes before activity to decrease cough or wheeze  Allergic rhinitis Continue allergen avoidance measures directed toward pollen, mold, pets, dust mite, and cockroach as listed below Continue cetirizine 10 mg once a day as needed for runny nose or itch Continue Flonase 2 sprays in each nostril once a day as needed for stuffy nose.  In the right nostril, point the applicator out toward the right ear. In the left nostril, point the applicator out toward the left ear Consider saline nasal rinses as needed for nasal symptoms. Use this before any medicated nasal sprays for best result Consider allergen immunotherapy if the treatment plan as listed above is not effective in controlling your symptoms  Allergic conjunctivitis Some over the counter eye drops include Pataday one drop in each eye once a day as needed for red, itchy eyes OR Zaditor one drop in each eye twice a day as needed for red itchy eyes.  Hives (urticaria) Use the least the medication while remaining hive free Cetirizine (Zyrtec) 10mg  twice a day and famotidine (Pepcid) 20 mg twice a day. If no symptoms for 7-14 days then decrease to Cetirizine (Zyrtec) 10mg  twice a day and famotidine (Pepcid) 20 mg once a day.  If no symptoms for 7-14 days then decrease to Cetirizine (Zyrtec) 10mg  twice a day.  If no symptoms for 7-14 days then decrease to Cetirizine (Zyrtec) 10mg  once a day.  May use Benadryl (diphenhydramine) as needed for breakthrough hives       If symptoms return, then step up dosage  Keep a detailed symptom journal including foods eaten, contact with allergens, medications taken, weather changes.   Food allergy For now, continue to avoid peanut, tree nut, sesame, sunflower, corn and lemon.  In case of an allergic reaction, take Benadryl 50 mg every 4 hours, and if life-threatening symptoms occur,  inject with EpiPen 0.3 mg. We have placed lab work that will help [place] evaluate your food allergy.  We will call you when the results become available Let's plan on skin testing to food allergies at your follow-up visit.  Call the clinic if this treatment plan is not working well for you  Follow up in 4 weeks or sooner if needed.  Reducing Pollen Exposure The American Academy of Allergy, Asthma and Immunology suggests the following steps to reduce your exposure to pollen during allergy seasons. Do not hang sheets or clothing out to dry; pollen may collect on these items. Do not mow lawns or spend time around freshly cut grass; mowing stirs up pollen. Keep windows closed at night.  Keep car windows closed while driving. Minimize morning activities outdoors, a time when pollen counts are usually at their highest. Stay indoors as much as possible when pollen counts or humidity is high and on windy days when pollen tends to remain in the air longer. Use air conditioning when possible.  Many air conditioners have filters that trap the pollen spores. Use a HEPA room air filter to remove pollen form the indoor air you breathe.  Control of Mold Allergen Mold and fungi can grow on a variety of surfaces provided certain temperature and moisture conditions exist.  Outdoor molds grow on plants, decaying vegetation and soil.  The major outdoor mold, Alternaria and Cladosporium, are found in very high numbers during hot and dry conditions.  Generally, a late Summer - Fall peak  is seen for common outdoor fungal spores.  Rain will temporarily lower outdoor mold spore count, but counts rise rapidly when the rainy period ends.  The most important indoor molds are Aspergillus and Penicillium.  Dark, humid and poorly ventilated basements are ideal sites for mold growth.  The next most common sites of mold growth are the bathroom and the kitchen.  Outdoor [employer] Use air conditioning and keep windows  closed Avoid exposure to decaying vegetation. Avoid leaf raking. Avoid grain handling. Consider wearing a face mask if working in moldy areas.  Indoor Mold Control Maintain humidity below 50%. Clean washable surfaces with 5% bleach solution. Remove sources e.g. Contaminated carpets.  Control of Dog or Cat Allergen Avoidance is the best way to manage a dog or cat allergy. If you have a dog or cat and are allergic to dog or cats, consider removing the dog or cat from the home. If you have a dog or cat but dont want to find it a new home, or if your family wants a pet even though someone in the household is allergic, here are some strategies that may help keep symptoms at bay:  Keep the pet out of your bedroom and restrict it to only a few rooms. Be advised that keeping the dog or cat in only one room will not limit the allergens to that room. Dont pet, hug or kiss the dog or cat; if you do, wash your hands with soap and water. High-efficiency particulate air (HEPA) cleaners run continuously in a bedroom or living room can reduce allergen levels over time. Regular use of a high-efficiency vacuum cleaner or a central vacuum can reduce allergen levels. Giving your dog or cat a bath at least once a week can reduce airborne allergen.   Control of Dust Mite Allergen Dust mites play a major role in allergic asthma and rhinitis. They occur in environments with high humidity wherever human skin is found. Dust mites absorb humidity from the atmosphere (ie, they do not drink) and feed on organic matter (including shed human and animal skin). Dust mites are a microscopic type of insect that you cannot see with the naked eye. High levels of dust mites have been detected from mattresses, pillows, carpets, upholstered furniture, bed covers, clothes, soft toys and any woven material. The principal allergen of the dust mite is found in its feces. A gram of dust may contain 1,000 mites and 250,000 fecal  particles. Mite antigen is easily measured in the air during house cleaning activities. Dust mites do not bite and do not cause harm to humans, other than by triggering allergies/asthma.  Ways to decrease your exposure to dust mites in your home:  1. Encase mattresses, box springs and pillows with a mite-impermeable barrier or cover  2. Wash sheets, blankets and drapes weekly in hot water (130 F) with detergent and dry them in a dryer on the hot setting.  3. Have the room cleaned frequently with a vacuum cleaner and a damp dust-mop. For carpeting or rugs, vacuuming with a vacuum cleaner equipped with a high-efficiency particulate air (HEPA) filter. The dust mite allergic individual should not be in a room which is being cleaned and should wait 1 hour after cleaning before going into the room.  4. Do not sleep on upholstered furniture (eg, couches).  5. If possible removing carpeting, upholstered furniture and drapery from the home is ideal. Horizontal blinds should be eliminated in the rooms where the person spends the most  time (bedroom, study, television room). Washable vinyl, roller-type shades are optimal.  6. Remove all non-washable stuffed toys from the bedroom. Wash stuffed toys weekly like sheets and blankets above.  7. Reduce indoor humidity to less than 50%. Inexpensive humidity monitors can be purchased at most hardware stores. Do not use a humidifier as can make the problem worse and are not recommended.  Control of Cockroach Allergen Cockroach allergen has been identified as an important cause of acute attacks of asthma, especially in urban settings.  There are fifty-five species of cockroach that exist in the [place], however only three, the [place], [place] species produce allergen that can affect patients with Asthma.  Allergens can be obtained from fecal particles, egg casings and secretions from cockroaches.    Remove food sources. Reduce access to  water. Seal access and entry points. Spray runways with 0.5-1% Diazinon or Chlorpyrifos Blow boric acid power under stoves and refrigerator. Place bait stations (hydramethylnon) at feeding sites.

## 2022-01-08 NOTE — Progress Notes (Signed)
[address] [place] [id number] Dept: [phone]  FOLLOW UP NOTE  Patient ID: Deanna Owens, female    DOB: 08-20-1986  Age: 36 y.o. MRN: [id number] Date of Office Visit: 01/09/2022  Assessment  Chief Complaint: Urticaria  HPI Deanna Owens is a 36 year old female who presents the clinic for follow-up visit.  She was last seen in this clinic on 09/12/2021 by Thermon LeylandAnne Ladislao Cohenour, FNP, for evaluation of asthma, allergic rhinitis, and food allergy to peanut, tree nut, sesame, sunflower, and shellfish.  In the interim, she reports that about 1-1/2 weeks ago she consumed corn which made her throat itchy.  On Saturday, she went to visit a family member in [place] who has dogs in their home.  She reports that she developed hives on Saturday evening that began about 5 minutes after the dog licked her and lasted for several hours after taking Benadryl.  She reports that on Saturday she also ate shrimp and crab.  On Sunday evening she ate hot wings and lemons and developed hives again on Monday morning at 3 AM.  At that time, she went to emergency department for further evaluation of symptoms.  At today's visit, she reports that she still has a few raised and itchy areas mainly on her neck.  She reports that with the each incident of hives occurring over the weekend she did not experience cardiopulmonary or gastrointestinal symptoms.  She has eaten crab and chicken since her last hive breakout with no adverse reaction.  She continues to avoid lemon, corn, peanuts, tree nuts, and sunflower seeds.  Asthma is reported as well controlled with no shortness of breath, cough, or wheeze.  She continues Agricultural engineerDulera and Stiolto and rarely needs to use her albuterol inhaler.  Allergic rhinitis is reported as well controlled with occasional nasal congestion and sneezing.  Allergic conjunctivitis is reported as well controlled with olopatadine as needed.  Her current medications are listed in the chart. Of note, she recently  visited her primary care provider and received a prednisone taper for hive control.  Drug Allergies:  Allergies  Allergen Reactions   Ketorolac Anaphylaxis   Lisinopril Swelling   Tramadol Anaphylaxis   Peanut-Containing Drug Products     Tree nuts   Propranolol     Physical Exam: BP 114/78    Pulse 83    Temp 98.6 F (37 C) (Temporal)    Resp 18    Ht 5\' 4"  (1.626 m)    Wt 164 lb (74.4 kg)    SpO2 100%    BMI 28.15 kg/m    Physical Exam Vitals reviewed.  Constitutional:      Appearance: Normal appearance.  HENT:     Head: Normocephalic and atraumatic.     Right Ear: Tympanic membrane normal.     Left Ear: Tympanic membrane normal.     Nose:     Comments: Bilateral nares slightly erythematous with clear nasal drainage noted.  Pharynx normal.  Ears normal.  Eyes normal.    Mouth/Throat:     Pharynx: Oropharynx is clear.  Eyes:     Conjunctiva/sclera: Conjunctivae normal.  Cardiovascular:     Rate and Rhythm: Normal rate and regular rhythm.     Heart sounds: Normal heart sounds. No murmur heard. Pulmonary:     Effort: Pulmonary effort is normal.     Breath sounds: Normal breath sounds.     Comments: Lungs clear to auscultation Musculoskeletal:        General: Normal range  of motion.     Cervical back: Normal range of motion and neck supple.  Skin:    General: Skin is warm and dry.     Comments: Slight raised red area on her neck.  No open areas or drainage noted.  Neurological:     Mental Status: She is alert and oriented to person, place, and time.  Psychiatric:        Mood and Affect: Mood normal.        Behavior: Behavior normal.        Thought Content: Thought content normal.        Judgment: Judgment normal.    Assessment and Plan: 1. Allergy with anaphylaxis due to food   2. Moderate persistent asthma without complication   3. Allergic conjunctivitis of both eyes   4. Seasonal and perennial allergic rhinitis     Patient Instructions  Asthma Continue  albuterol 2 puffs once every 4 hours as needed for cough or wheeze You may use albuterol 2 puffs 5 to 15 minutes before activity to decrease cough or wheeze  Allergic rhinitis Continue allergen avoidance measures directed toward pollen, mold, pets, dust mite, and cockroach as listed below Continue cetirizine 10 mg once a day as needed for runny nose or itch Continue Flonase 2 sprays in each nostril once a day as needed for stuffy nose.  In the right nostril, point the applicator out toward the right ear. In the left nostril, point the applicator out toward the left ear Consider saline nasal rinses as needed for nasal symptoms. Use this before any medicated nasal sprays for best result Consider allergen immunotherapy if the treatment plan as listed above is not effective in controlling your symptoms  Allergic conjunctivitis Some over the counter eye drops include Pataday one drop in each eye once a day as needed for red, itchy eyes OR Zaditor one drop in each eye twice a day as needed for red itchy eyes.  Hives (urticaria) Use the least the medication while remaining hive free Cetirizine (Zyrtec) 10mg  twice a day and famotidine (Pepcid) 20 mg twice a day. If no symptoms for 7-14 days then decrease to Cetirizine (Zyrtec) 10mg  twice a day and famotidine (Pepcid) 20 mg once a day.  If no symptoms for 7-14 days then decrease to Cetirizine (Zyrtec) 10mg  twice a day.  If no symptoms for 7-14 days then decrease to Cetirizine (Zyrtec) 10mg  once a day.  May use Benadryl (diphenhydramine) as needed for breakthrough hives       If symptoms return, then step up dosage  Keep a detailed symptom journal including foods eaten, contact with allergens, medications taken, weather changes.   Food allergy For now, continue to avoid peanut, tree nut, sesame, sunflower, corn and lemon.  In case of an allergic reaction, take Benadryl 50 mg every 4 hours, and if life-threatening symptoms occur, inject with EpiPen  0.3 mg. We have placed lab work that will help evaluate your food allergy.  We will call you when the results become available Let's plan on skin testing to food allergies at your follow-up visit.  Call the clinic if this treatment plan is not working well for you  Follow up in 4 weeks or sooner if needed.   Return in about 4 weeks (around 02/06/2022), or if symptoms worsen or fail to improve.    Thank you for the opportunity to care for this patient.  Please do not hesitate to contact me with questions.  ,  FNP [facility]

## 2022-01-09 ENCOUNTER — Other Ambulatory Visit: Payer: Self-pay

## 2022-01-09 ENCOUNTER — Ambulatory Visit (INDEPENDENT_AMBULATORY_CARE_PROVIDER_SITE_OTHER): Payer: Medicaid Other | Admitting: Family Medicine

## 2022-01-09 ENCOUNTER — Encounter: Payer: Self-pay | Admitting: Family Medicine

## 2022-01-09 VITALS — BP 114/78 | HR 83 | Temp 98.6°F | Resp 18 | Ht 64.0 in | Wt 164.0 lb

## 2022-01-09 DIAGNOSIS — H1013 Acute atopic conjunctivitis, bilateral: Secondary | ICD-10-CM

## 2022-01-09 DIAGNOSIS — J3089 Other allergic rhinitis: Secondary | ICD-10-CM

## 2022-01-09 DIAGNOSIS — T7800XA Anaphylactic reaction due to unspecified food, initial encounter: Secondary | ICD-10-CM

## 2022-01-09 DIAGNOSIS — J4540 Moderate persistent asthma, uncomplicated: Secondary | ICD-10-CM

## 2022-01-21 ENCOUNTER — Telehealth: Payer: Self-pay | Admitting: Family Medicine

## 2022-01-21 NOTE — Telephone Encounter (Signed)
Pt would like a nurse to call her back

## 2022-01-21 NOTE — Telephone Encounter (Signed)
Tried calling 4 times in a row and it kept calling to contact my directory the call could not be completed as dialed

## 2022-01-22 LAB — ALLERGENS(7)
Brazil Nut IgE: 0.18 kU/L — AB
F020-IgE Almond: 0.4 kU/L — AB
F202-IgE Cashew Nut: <0.1 kU/L
Hazelnut (Filbert) IgE: 2.88 kU/L — AB
Pecan Nut IgE: 0.3 kU/L — AB
Walnut IgE: 3.07 kU/L — AB

## 2022-01-22 LAB — PEANUT COMPONENTS
F352-IgE Ara h 8: <0.1 kU/L
F422-IgE Ara h 1: <0.1 kU/L
F423-IgE Ara h 2: <0.1 kU/L
F424-IgE Ara h 3: <0.1 kU/L
F427-IgE Ara h 9: <0.1 kU/L
F447-IgE Ara h 6: <0.1 kU/L

## 2022-01-22 LAB — ALLERGEN SUNFLOWER SEED K84: Sunflower Seed k84: 3.55 kU/L — AB

## 2022-01-22 LAB — ALLERGEN COMPONENT COMMENTS

## 2022-01-22 LAB — IGE PEANUT W/COMPONENT REFLEX: Peanut, IgE: 5.92 kU/L — AB

## 2022-01-22 LAB — ALLERGEN SESAME F10: Sesame Seed IgE: 4.76 kU/L — AB

## 2022-01-22 LAB — ALLERGEN, LEMON, F208: Lemon: 4.67 kU/L — AB

## 2022-01-22 LAB — ALLERGEN, CORN F8: Allergen Corn, IgE: 5.35 kU/L — AB

## 2022-01-22 NOTE — Telephone Encounter (Signed)
Lab results reviewed with the patient. She will continue to avoid peanuts, tree nuts, sesame, sunflower, corn, and lemon.  She will return to clinic for skin prick testing.  She understands not to take antihistamines for 3 days before the skin prick testing appointment.

## 2022-01-22 NOTE — Telephone Encounter (Signed)
Pt called back informed her that once we get all the lab results we can call her with them she did seen some on mychart and used some choice words to explain her frustration at her lab results, not [place].

## 2022-01-23 NOTE — Telephone Encounter (Signed)
Can you please have this patient make an appointment for skin testing to lemon with a possible food challenge to lemon on the same day. Please have her bring 2 lemons and some sugar to the appointment. Please send this out in the written directions for food challenge.  Please have her call me with any questions. Thank you

## 2022-01-23 NOTE — Telephone Encounter (Signed)
Pt is scheduled for a lemon challenge 01/30/22 at 830 she was informed to bring 2 lemons and sugar and stay off antihistamine for 3 days prior

## 2022-01-29 NOTE — Progress Notes (Signed)
[place] [id number] Dept: [phone]  FOLLOW UP NOTE  Patient ID: Deanna Owens, female    DOB: [birth year]  Age: 36 y.o. MRN: [id number] Date of Office Visit: 01/30/2022  Assessment  Chief Complaint: Food/Drug Challenge (/)  HPI Deanna Owens is a 36 year old female who presents the clinic for follow-up visit with skin testing and possible food challenge to employment.  She was last seen in this clinic on 01/09/2022 by Gareth Morgan, FNP, for evaluation of hives, asthma, allergic rhinitis, allergic conjunctivitis, and food allergy to peanut, tree nut, sesame, sunflower, corn, and lemon.  At today's visit, she reports she is feeling well overall.  She denies cardiopulmonary, integumentary, or gastrointestinal symptoms at this time.  She continues to avoid peanuts, tree nuts, sesame, sunflower, corn, and lemon with no accidental ingestion or EpiPen use since her last visit to this clinic.  Her EpiPen's are up-to-date.  Her current medications are listed in the chart.   Drug Allergies:  Allergies  Allergen Reactions   Ketorolac Anaphylaxis   Lisinopril Swelling   Tramadol Anaphylaxis   Peanut-Containing Drug Products     Tree nuts   Propranolol     Physical Exam: There were no vitals taken for this visit.   Physical Exam Vitals reviewed.  Constitutional:      Appearance: Normal appearance.  HENT:     Head: Normocephalic and atraumatic.     Right Ear: Tympanic membrane normal.     Left Ear: Tympanic membrane normal.     Nose:     Comments: Bilateral nares slightly erythematous with clear nasal drainage noted.  Pharynx normal.  Ears normal.  Eyes normal.    Mouth/Throat:     Pharynx: Oropharynx is clear.  Eyes:     Conjunctiva/sclera: Conjunctivae normal.  Cardiovascular:     Rate and Rhythm: Normal rate and regular rhythm.     Heart sounds: Normal heart sounds. No murmur heard. Pulmonary:     Effort: Pulmonary effort is normal.     Breath sounds: Normal  breath sounds.     Comments: Lungs clear to auscultation Musculoskeletal:        General: Normal range of motion.     Cervical back: Normal range of motion and neck supple.  Skin:    General: Skin is warm and dry.  Neurological:     Mental Status: She is alert and oriented to person, place, and time.  Psychiatric:        Mood and Affect: Mood normal.        Behavior: Behavior normal.        Thought Content: Thought content normal.        Judgment: Judgment normal.    Diagnostics: Percutaneous skin prick testing to lemon was negative with adequate controls.   Procedure note: Written consent obtained Open graded lemon oral challenge: The patient was able to tolerate the challenge today without adverse signs or symptoms. Vital signs were stable throughout the challenge and observation period. She received multiple doses separated by 15 minutes, each of which was separated by vitals and a brief physical exam. She received the following doses: lip rub, 1/16th lemon, 1/8 lemon, 1/4 lemon, 1/2 lemon, and 1/2 plus remainder of lemon for a total of 1 whole lemon. She was monitored for 60 minutes following the last dose.  Total testing time: 152 minutes  The patient had negative skin prick tests to lemon  and was able to tolerate the open graded oral  challenge today without adverse signs or symptoms. Therefore, she has the same risk of systemic reaction associated with the consumption of lemon  as the general population.   Assessment and Plan: 1. Allergy with anaphylaxis due to food     Patient Instructions  In office oral food challenge  Deanna Owens was able to tolerate the lemon food challenge today at the office without adverse signs or symptoms of an allergic reaction. Therefore, she has the same risk of systemic reaction associated with the consumption of lemon as the general population.  - Do not give any lemon  for the next 24 hours. - Monitor for allergic symptoms such as rash,  wheezing, diarrhea, swelling, and vomiting for the next 24 hours. If severe symptoms occur, treat with EpiPen injection and call 911. For less severe symptoms treat with Benadryl 50 mg every 4 hours and call the clinic.  - If no allergic symptoms are evident, reintroduce lemon  into the diet. If you develop an allergic reaction to lemon , record what was eaten the amount eaten, preparation method, time from ingestion to reaction, and symptoms.   Food allergy Continue to avoid peanuts, tree nuts, sesame, sunflower, and corn. In case of an allergic reaction, give Benadryl 50 mg every 4 hours, and if life-threatening symptoms occur, inject with EpiPen 0.3 mg. Make an appointment for a peanut challenge. We will do a skin prick test to peanut and move to peanut oral challenge if skin testing looks favorable. Remember to stop antihistamines for 3 days before the food testing date.  Call the clinic if this treatment plan is not working well for you  Follow up in 1 month or sooner if needed.   Return in about 4 weeks (around 02/27/2022), or if symptoms worsen or fail to improve.    Thank you for the opportunity to care for this patient.  Please do not hesitate to contact me with questions.  Gareth Morgan, FNP [facility]

## 2022-01-30 ENCOUNTER — Other Ambulatory Visit: Payer: Self-pay

## 2022-01-30 ENCOUNTER — Ambulatory Visit (INDEPENDENT_AMBULATORY_CARE_PROVIDER_SITE_OTHER): Payer: Medicaid Other | Admitting: Family Medicine

## 2022-01-30 ENCOUNTER — Encounter: Payer: Self-pay | Admitting: Family Medicine

## 2022-01-30 DIAGNOSIS — T7800XD Anaphylactic reaction due to unspecified food, subsequent encounter: Secondary | ICD-10-CM | POA: Diagnosis not present

## 2022-01-30 DIAGNOSIS — T7800XA Anaphylactic reaction due to unspecified food, initial encounter: Secondary | ICD-10-CM

## 2022-01-30 NOTE — Patient Instructions (Addendum)
In office oral food challenge  Audris Speaker was able to tolerate the lemon food challenge today at the office without adverse signs or symptoms of an allergic reaction. Therefore, she has the same risk of systemic reaction associated with the consumption of lemon as the general population.  - Do not give any lemon  for the next 24 hours. - Monitor for allergic symptoms such as rash, wheezing, diarrhea, swelling, and vomiting for the next 24 hours. If severe symptoms occur, treat with EpiPen injection and call 911. For less severe symptoms treat with Benadryl 50 mg every 4 hours and call the clinic.  - If no allergic symptoms are evident, reintroduce lemon  into the diet. If you develop an allergic reaction to lemon , record what was eaten the amount eaten, preparation method, time from ingestion to reaction, and symptoms.   Food allergy Continue to avoid peanuts, tree nuts, sesame, sunflower, and corn. In case of an allergic reaction, give Benadryl 50 mg every 4 hours, and if life-threatening symptoms occur, inject with EpiPen 0.3 mg. Make an appointment for a peanut challenge. We will do a skin prick test to peanut and move to peanut oral challenge if skin testing looks favorable. Remember to stop antihistamines for 3 days before the food testing date.  Call the clinic if this treatment plan is not working well for you  Follow up in 1 month or sooner if needed.

## 2022-03-10 ENCOUNTER — Other Ambulatory Visit: Payer: Self-pay | Admitting: Family Medicine
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
## 2022-12-03 ENCOUNTER — Other Ambulatory Visit: Payer: Self-pay | Admitting: Family Medicine
